# Patient Record
Sex: Female | Born: 1945 | Race: White | Hispanic: No | Marital: Married | Smoking: Never smoker
Health system: Southern US, Community
[De-identification: ages and names within clinical notes are randomized; demographics above are authoritative.]

## PROBLEM LIST (undated history)

## (undated) DIAGNOSIS — C569 Malignant neoplasm of unspecified ovary: Secondary | ICD-10-CM

## (undated) DIAGNOSIS — E785 Hyperlipidemia, unspecified: Secondary | ICD-10-CM

## (undated) DIAGNOSIS — M1990 Unspecified osteoarthritis, unspecified site: Secondary | ICD-10-CM

## (undated) DIAGNOSIS — I10 Essential (primary) hypertension: Secondary | ICD-10-CM

## (undated) DIAGNOSIS — E119 Type 2 diabetes mellitus without complications: Secondary | ICD-10-CM

## (undated) HISTORY — PX: KNEE SURGERY: SHX244

## (undated) HISTORY — PX: FRACTURE SURGERY: SHX138

## (undated) HISTORY — PX: ABDOMINAL HYSTERECTOMY: SHX81

## (undated) HISTORY — PX: LAPAROSCOPIC OOPHERECTOMY: SHX6507

## (undated) HISTORY — PX: APPENDECTOMY: SHX54

---

## 2005-08-16 ENCOUNTER — Ambulatory Visit: Payer: Self-pay | Admitting: Family Medicine

## 2006-12-27 ENCOUNTER — Ambulatory Visit: Payer: Self-pay | Admitting: Family Medicine

## 2007-12-28 ENCOUNTER — Ambulatory Visit: Payer: Self-pay | Admitting: Family Medicine

## 2009-04-17 ENCOUNTER — Ambulatory Visit: Payer: Self-pay | Admitting: Family Medicine

## 2009-06-03 ENCOUNTER — Ambulatory Visit: Payer: Self-pay | Admitting: Physician Assistant

## 2009-06-29 ENCOUNTER — Ambulatory Visit: Payer: Self-pay | Admitting: Physician Assistant

## 2010-03-25 ENCOUNTER — Ambulatory Visit: Payer: Self-pay | Admitting: Ophthalmology

## 2010-04-29 ENCOUNTER — Ambulatory Visit: Payer: Self-pay | Admitting: Ophthalmology

## 2015-03-03 DIAGNOSIS — M530 Cervicocranial syndrome: Secondary | ICD-10-CM | POA: Diagnosis not present

## 2015-03-03 DIAGNOSIS — M545 Low back pain: Secondary | ICD-10-CM | POA: Diagnosis not present

## 2015-03-03 DIAGNOSIS — M9903 Segmental and somatic dysfunction of lumbar region: Secondary | ICD-10-CM | POA: Diagnosis not present

## 2015-03-03 DIAGNOSIS — M9901 Segmental and somatic dysfunction of cervical region: Secondary | ICD-10-CM | POA: Diagnosis not present

## 2015-03-04 DIAGNOSIS — I10 Essential (primary) hypertension: Secondary | ICD-10-CM | POA: Diagnosis not present

## 2015-03-04 DIAGNOSIS — R52 Pain, unspecified: Secondary | ICD-10-CM | POA: Diagnosis not present

## 2015-03-04 DIAGNOSIS — F329 Major depressive disorder, single episode, unspecified: Secondary | ICD-10-CM | POA: Diagnosis not present

## 2015-03-04 DIAGNOSIS — H9190 Unspecified hearing loss, unspecified ear: Secondary | ICD-10-CM | POA: Diagnosis not present

## 2015-03-04 DIAGNOSIS — R81 Glycosuria: Secondary | ICD-10-CM | POA: Diagnosis not present

## 2015-03-04 DIAGNOSIS — M1990 Unspecified osteoarthritis, unspecified site: Secondary | ICD-10-CM | POA: Diagnosis not present

## 2015-03-04 DIAGNOSIS — E119 Type 2 diabetes mellitus without complications: Secondary | ICD-10-CM | POA: Diagnosis not present

## 2015-03-14 DIAGNOSIS — M545 Low back pain: Secondary | ICD-10-CM | POA: Diagnosis not present

## 2015-03-14 DIAGNOSIS — M9903 Segmental and somatic dysfunction of lumbar region: Secondary | ICD-10-CM | POA: Diagnosis not present

## 2015-03-14 DIAGNOSIS — M9901 Segmental and somatic dysfunction of cervical region: Secondary | ICD-10-CM | POA: Diagnosis not present

## 2015-03-14 DIAGNOSIS — M531 Cervicobrachial syndrome: Secondary | ICD-10-CM | POA: Diagnosis not present

## 2015-03-17 DIAGNOSIS — M531 Cervicobrachial syndrome: Secondary | ICD-10-CM | POA: Diagnosis not present

## 2015-03-17 DIAGNOSIS — M9903 Segmental and somatic dysfunction of lumbar region: Secondary | ICD-10-CM | POA: Diagnosis not present

## 2015-03-17 DIAGNOSIS — M545 Low back pain: Secondary | ICD-10-CM | POA: Diagnosis not present

## 2015-03-17 DIAGNOSIS — M9901 Segmental and somatic dysfunction of cervical region: Secondary | ICD-10-CM | POA: Diagnosis not present

## 2015-03-18 DIAGNOSIS — R81 Glycosuria: Secondary | ICD-10-CM | POA: Diagnosis not present

## 2015-03-18 DIAGNOSIS — M1990 Unspecified osteoarthritis, unspecified site: Secondary | ICD-10-CM | POA: Diagnosis not present

## 2015-03-18 DIAGNOSIS — E119 Type 2 diabetes mellitus without complications: Secondary | ICD-10-CM | POA: Diagnosis not present

## 2015-03-18 DIAGNOSIS — I10 Essential (primary) hypertension: Secondary | ICD-10-CM | POA: Diagnosis not present

## 2015-03-18 DIAGNOSIS — H9190 Unspecified hearing loss, unspecified ear: Secondary | ICD-10-CM | POA: Diagnosis not present

## 2015-03-20 DIAGNOSIS — M25561 Pain in right knee: Secondary | ICD-10-CM | POA: Diagnosis not present

## 2015-03-20 DIAGNOSIS — G8929 Other chronic pain: Secondary | ICD-10-CM | POA: Diagnosis not present

## 2015-03-20 DIAGNOSIS — M170 Bilateral primary osteoarthritis of knee: Secondary | ICD-10-CM | POA: Diagnosis not present

## 2015-03-20 DIAGNOSIS — M1611 Unilateral primary osteoarthritis, right hip: Secondary | ICD-10-CM | POA: Diagnosis not present

## 2015-03-20 DIAGNOSIS — M1612 Unilateral primary osteoarthritis, left hip: Secondary | ICD-10-CM | POA: Diagnosis not present

## 2015-03-20 DIAGNOSIS — M25551 Pain in right hip: Secondary | ICD-10-CM | POA: Diagnosis not present

## 2015-03-20 DIAGNOSIS — M75102 Unspecified rotator cuff tear or rupture of left shoulder, not specified as traumatic: Secondary | ICD-10-CM | POA: Diagnosis not present

## 2015-04-01 DIAGNOSIS — M545 Low back pain: Secondary | ICD-10-CM | POA: Diagnosis not present

## 2015-04-01 DIAGNOSIS — M9903 Segmental and somatic dysfunction of lumbar region: Secondary | ICD-10-CM | POA: Diagnosis not present

## 2015-04-01 DIAGNOSIS — M9901 Segmental and somatic dysfunction of cervical region: Secondary | ICD-10-CM | POA: Diagnosis not present

## 2015-04-01 DIAGNOSIS — M531 Cervicobrachial syndrome: Secondary | ICD-10-CM | POA: Diagnosis not present

## 2015-04-07 DIAGNOSIS — F419 Anxiety disorder, unspecified: Secondary | ICD-10-CM | POA: Diagnosis not present

## 2015-04-07 DIAGNOSIS — F329 Major depressive disorder, single episode, unspecified: Secondary | ICD-10-CM | POA: Diagnosis not present

## 2015-04-07 DIAGNOSIS — Z9114 Patient's other noncompliance with medication regimen: Secondary | ICD-10-CM | POA: Diagnosis not present

## 2015-04-07 DIAGNOSIS — M1990 Unspecified osteoarthritis, unspecified site: Secondary | ICD-10-CM | POA: Diagnosis not present

## 2015-04-07 DIAGNOSIS — E119 Type 2 diabetes mellitus without complications: Secondary | ICD-10-CM | POA: Diagnosis not present

## 2015-04-07 DIAGNOSIS — R454 Irritability and anger: Secondary | ICD-10-CM | POA: Diagnosis not present

## 2015-04-07 DIAGNOSIS — I10 Essential (primary) hypertension: Secondary | ICD-10-CM | POA: Diagnosis not present

## 2015-04-07 DIAGNOSIS — B379 Candidiasis, unspecified: Secondary | ICD-10-CM | POA: Diagnosis not present

## 2015-06-02 DIAGNOSIS — M24252 Disorder of ligament, left hip: Secondary | ICD-10-CM | POA: Diagnosis not present

## 2015-06-02 DIAGNOSIS — M11252 Other chondrocalcinosis, left hip: Secondary | ICD-10-CM | POA: Diagnosis not present

## 2015-06-02 DIAGNOSIS — M19012 Primary osteoarthritis, left shoulder: Secondary | ICD-10-CM | POA: Diagnosis not present

## 2015-06-02 DIAGNOSIS — M479 Spondylosis, unspecified: Secondary | ICD-10-CM | POA: Diagnosis not present

## 2015-06-02 DIAGNOSIS — Z91041 Radiographic dye allergy status: Secondary | ICD-10-CM | POA: Diagnosis not present

## 2015-06-02 DIAGNOSIS — M2550 Pain in unspecified joint: Secondary | ICD-10-CM | POA: Diagnosis not present

## 2015-06-02 DIAGNOSIS — E669 Obesity, unspecified: Secondary | ICD-10-CM | POA: Diagnosis not present

## 2015-06-02 DIAGNOSIS — M11211 Other chondrocalcinosis, right shoulder: Secondary | ICD-10-CM | POA: Diagnosis not present

## 2015-06-02 DIAGNOSIS — M19011 Primary osteoarthritis, right shoulder: Secondary | ICD-10-CM | POA: Diagnosis not present

## 2015-06-02 DIAGNOSIS — M24251 Disorder of ligament, right hip: Secondary | ICD-10-CM | POA: Diagnosis not present

## 2015-06-02 DIAGNOSIS — E119 Type 2 diabetes mellitus without complications: Secondary | ICD-10-CM | POA: Diagnosis not present

## 2015-06-02 DIAGNOSIS — Z8261 Family history of arthritis: Secondary | ICD-10-CM | POA: Diagnosis not present

## 2015-06-02 DIAGNOSIS — M160 Bilateral primary osteoarthritis of hip: Secondary | ICD-10-CM | POA: Diagnosis not present

## 2015-06-02 DIAGNOSIS — M11251 Other chondrocalcinosis, right hip: Secondary | ICD-10-CM | POA: Diagnosis not present

## 2015-06-02 DIAGNOSIS — M11241 Other chondrocalcinosis, right hand: Secondary | ICD-10-CM | POA: Diagnosis not present

## 2015-06-02 DIAGNOSIS — Z791 Long term (current) use of non-steroidal anti-inflammatories (NSAID): Secondary | ICD-10-CM | POA: Diagnosis not present

## 2015-06-02 DIAGNOSIS — M11242 Other chondrocalcinosis, left hand: Secondary | ICD-10-CM | POA: Diagnosis not present

## 2015-06-02 DIAGNOSIS — M19041 Primary osteoarthritis, right hand: Secondary | ICD-10-CM | POA: Diagnosis not present

## 2015-06-02 DIAGNOSIS — Z79899 Other long term (current) drug therapy: Secondary | ICD-10-CM | POA: Diagnosis not present

## 2015-06-02 DIAGNOSIS — I10 Essential (primary) hypertension: Secondary | ICD-10-CM | POA: Diagnosis not present

## 2015-06-02 DIAGNOSIS — M7502 Adhesive capsulitis of left shoulder: Secondary | ICD-10-CM | POA: Diagnosis not present

## 2015-06-02 DIAGNOSIS — Z794 Long term (current) use of insulin: Secondary | ICD-10-CM | POA: Diagnosis not present

## 2015-06-02 DIAGNOSIS — M170 Bilateral primary osteoarthritis of knee: Secondary | ICD-10-CM | POA: Diagnosis not present

## 2015-06-02 DIAGNOSIS — M19042 Primary osteoarthritis, left hand: Secondary | ICD-10-CM | POA: Diagnosis not present

## 2015-06-02 DIAGNOSIS — M7989 Other specified soft tissue disorders: Secondary | ICD-10-CM | POA: Diagnosis not present

## 2015-06-02 DIAGNOSIS — M1990 Unspecified osteoarthritis, unspecified site: Secondary | ICD-10-CM | POA: Diagnosis not present

## 2015-06-02 DIAGNOSIS — M11212 Other chondrocalcinosis, left shoulder: Secondary | ICD-10-CM | POA: Diagnosis not present

## 2015-06-02 DIAGNOSIS — E785 Hyperlipidemia, unspecified: Secondary | ICD-10-CM | POA: Diagnosis not present

## 2015-09-24 DIAGNOSIS — E7800 Pure hypercholesterolemia, unspecified: Secondary | ICD-10-CM | POA: Diagnosis not present

## 2015-09-24 DIAGNOSIS — R109 Unspecified abdominal pain: Secondary | ICD-10-CM | POA: Diagnosis not present

## 2015-09-24 DIAGNOSIS — Z9114 Patient's other noncompliance with medication regimen: Secondary | ICD-10-CM | POA: Diagnosis not present

## 2015-09-24 DIAGNOSIS — B379 Candidiasis, unspecified: Secondary | ICD-10-CM | POA: Diagnosis not present

## 2015-09-24 DIAGNOSIS — E119 Type 2 diabetes mellitus without complications: Secondary | ICD-10-CM | POA: Diagnosis not present

## 2015-09-24 DIAGNOSIS — I10 Essential (primary) hypertension: Secondary | ICD-10-CM | POA: Diagnosis not present

## 2015-09-24 DIAGNOSIS — R454 Irritability and anger: Secondary | ICD-10-CM | POA: Diagnosis not present

## 2015-09-24 DIAGNOSIS — F419 Anxiety disorder, unspecified: Secondary | ICD-10-CM | POA: Diagnosis not present

## 2015-09-24 DIAGNOSIS — F329 Major depressive disorder, single episode, unspecified: Secondary | ICD-10-CM | POA: Diagnosis not present

## 2015-09-24 DIAGNOSIS — M1990 Unspecified osteoarthritis, unspecified site: Secondary | ICD-10-CM | POA: Diagnosis not present

## 2015-10-01 DIAGNOSIS — B354 Tinea corporis: Secondary | ICD-10-CM | POA: Diagnosis not present

## 2015-10-01 DIAGNOSIS — I10 Essential (primary) hypertension: Secondary | ICD-10-CM | POA: Diagnosis not present

## 2015-10-01 DIAGNOSIS — L03119 Cellulitis of unspecified part of limb: Secondary | ICD-10-CM | POA: Diagnosis not present

## 2015-10-01 DIAGNOSIS — E119 Type 2 diabetes mellitus without complications: Secondary | ICD-10-CM | POA: Diagnosis not present

## 2015-10-01 DIAGNOSIS — M1990 Unspecified osteoarthritis, unspecified site: Secondary | ICD-10-CM | POA: Diagnosis not present

## 2015-10-01 DIAGNOSIS — Z9114 Patient's other noncompliance with medication regimen: Secondary | ICD-10-CM | POA: Diagnosis not present

## 2015-10-01 DIAGNOSIS — B379 Candidiasis, unspecified: Secondary | ICD-10-CM | POA: Diagnosis not present

## 2015-10-09 DIAGNOSIS — H9190 Unspecified hearing loss, unspecified ear: Secondary | ICD-10-CM | POA: Diagnosis not present

## 2015-10-09 DIAGNOSIS — I10 Essential (primary) hypertension: Secondary | ICD-10-CM | POA: Diagnosis not present

## 2015-10-09 DIAGNOSIS — Z9114 Patient's other noncompliance with medication regimen: Secondary | ICD-10-CM | POA: Diagnosis not present

## 2015-10-16 DIAGNOSIS — R0981 Nasal congestion: Secondary | ICD-10-CM | POA: Diagnosis not present

## 2015-10-16 DIAGNOSIS — R05 Cough: Secondary | ICD-10-CM | POA: Diagnosis not present

## 2015-10-16 DIAGNOSIS — J302 Other seasonal allergic rhinitis: Secondary | ICD-10-CM | POA: Diagnosis not present

## 2015-10-28 ENCOUNTER — Ambulatory Visit: Payer: Self-pay | Admitting: *Deleted

## 2015-11-05 DIAGNOSIS — E119 Type 2 diabetes mellitus without complications: Secondary | ICD-10-CM | POA: Diagnosis not present

## 2015-11-14 DIAGNOSIS — Z9114 Patient's other noncompliance with medication regimen: Secondary | ICD-10-CM | POA: Diagnosis not present

## 2015-11-14 DIAGNOSIS — B379 Candidiasis, unspecified: Secondary | ICD-10-CM | POA: Diagnosis not present

## 2015-11-14 DIAGNOSIS — F329 Major depressive disorder, single episode, unspecified: Secondary | ICD-10-CM | POA: Diagnosis not present

## 2015-11-14 DIAGNOSIS — I10 Essential (primary) hypertension: Secondary | ICD-10-CM | POA: Diagnosis not present

## 2015-11-14 DIAGNOSIS — E119 Type 2 diabetes mellitus without complications: Secondary | ICD-10-CM | POA: Diagnosis not present

## 2015-11-28 DIAGNOSIS — M9903 Segmental and somatic dysfunction of lumbar region: Secondary | ICD-10-CM | POA: Diagnosis not present

## 2015-11-28 DIAGNOSIS — M531 Cervicobrachial syndrome: Secondary | ICD-10-CM | POA: Diagnosis not present

## 2015-11-28 DIAGNOSIS — M5386 Other specified dorsopathies, lumbar region: Secondary | ICD-10-CM | POA: Diagnosis not present

## 2015-11-28 DIAGNOSIS — M9901 Segmental and somatic dysfunction of cervical region: Secondary | ICD-10-CM | POA: Diagnosis not present

## 2015-12-10 DIAGNOSIS — M5441 Lumbago with sciatica, right side: Secondary | ICD-10-CM | POA: Diagnosis not present

## 2015-12-10 DIAGNOSIS — M9903 Segmental and somatic dysfunction of lumbar region: Secondary | ICD-10-CM | POA: Diagnosis not present

## 2015-12-10 DIAGNOSIS — M531 Cervicobrachial syndrome: Secondary | ICD-10-CM | POA: Diagnosis not present

## 2015-12-10 DIAGNOSIS — M9901 Segmental and somatic dysfunction of cervical region: Secondary | ICD-10-CM | POA: Diagnosis not present

## 2015-12-19 DIAGNOSIS — M9901 Segmental and somatic dysfunction of cervical region: Secondary | ICD-10-CM | POA: Diagnosis not present

## 2015-12-19 DIAGNOSIS — M531 Cervicobrachial syndrome: Secondary | ICD-10-CM | POA: Diagnosis not present

## 2016-01-06 DIAGNOSIS — F329 Major depressive disorder, single episode, unspecified: Secondary | ICD-10-CM | POA: Diagnosis not present

## 2016-01-06 DIAGNOSIS — B379 Candidiasis, unspecified: Secondary | ICD-10-CM | POA: Diagnosis not present

## 2016-01-06 DIAGNOSIS — Z9119 Patient's noncompliance with other medical treatment and regimen: Secondary | ICD-10-CM | POA: Diagnosis not present

## 2016-01-06 DIAGNOSIS — I10 Essential (primary) hypertension: Secondary | ICD-10-CM | POA: Diagnosis not present

## 2016-01-06 DIAGNOSIS — E119 Type 2 diabetes mellitus without complications: Secondary | ICD-10-CM | POA: Diagnosis not present

## 2016-01-27 DIAGNOSIS — M5137 Other intervertebral disc degeneration, lumbosacral region: Secondary | ICD-10-CM | POA: Diagnosis not present

## 2016-01-27 DIAGNOSIS — M9901 Segmental and somatic dysfunction of cervical region: Secondary | ICD-10-CM | POA: Diagnosis not present

## 2016-01-27 DIAGNOSIS — M9903 Segmental and somatic dysfunction of lumbar region: Secondary | ICD-10-CM | POA: Diagnosis not present

## 2016-01-27 DIAGNOSIS — J209 Acute bronchitis, unspecified: Secondary | ICD-10-CM | POA: Diagnosis not present

## 2016-01-27 DIAGNOSIS — M531 Cervicobrachial syndrome: Secondary | ICD-10-CM | POA: Diagnosis not present

## 2016-02-18 DIAGNOSIS — M5137 Other intervertebral disc degeneration, lumbosacral region: Secondary | ICD-10-CM | POA: Diagnosis not present

## 2016-02-18 DIAGNOSIS — M9901 Segmental and somatic dysfunction of cervical region: Secondary | ICD-10-CM | POA: Diagnosis not present

## 2016-02-18 DIAGNOSIS — M9903 Segmental and somatic dysfunction of lumbar region: Secondary | ICD-10-CM | POA: Diagnosis not present

## 2016-02-18 DIAGNOSIS — M531 Cervicobrachial syndrome: Secondary | ICD-10-CM | POA: Diagnosis not present

## 2016-02-26 DIAGNOSIS — B379 Candidiasis, unspecified: Secondary | ICD-10-CM | POA: Diagnosis not present

## 2016-02-26 DIAGNOSIS — M1991 Primary osteoarthritis, unspecified site: Secondary | ICD-10-CM | POA: Diagnosis not present

## 2016-02-26 DIAGNOSIS — E1310 Other specified diabetes mellitus with ketoacidosis without coma: Secondary | ICD-10-CM | POA: Diagnosis not present

## 2016-03-02 DIAGNOSIS — M531 Cervicobrachial syndrome: Secondary | ICD-10-CM | POA: Diagnosis not present

## 2016-03-02 DIAGNOSIS — M5137 Other intervertebral disc degeneration, lumbosacral region: Secondary | ICD-10-CM | POA: Diagnosis not present

## 2016-03-02 DIAGNOSIS — M9901 Segmental and somatic dysfunction of cervical region: Secondary | ICD-10-CM | POA: Diagnosis not present

## 2016-03-02 DIAGNOSIS — M9903 Segmental and somatic dysfunction of lumbar region: Secondary | ICD-10-CM | POA: Diagnosis not present

## 2016-03-03 DIAGNOSIS — I10 Essential (primary) hypertension: Secondary | ICD-10-CM | POA: Diagnosis not present

## 2016-03-03 DIAGNOSIS — E1165 Type 2 diabetes mellitus with hyperglycemia: Secondary | ICD-10-CM | POA: Diagnosis not present

## 2016-03-03 DIAGNOSIS — E119 Type 2 diabetes mellitus without complications: Secondary | ICD-10-CM | POA: Diagnosis not present

## 2016-03-12 DIAGNOSIS — M531 Cervicobrachial syndrome: Secondary | ICD-10-CM | POA: Diagnosis not present

## 2016-03-12 DIAGNOSIS — M9903 Segmental and somatic dysfunction of lumbar region: Secondary | ICD-10-CM | POA: Diagnosis not present

## 2016-03-12 DIAGNOSIS — M9901 Segmental and somatic dysfunction of cervical region: Secondary | ICD-10-CM | POA: Diagnosis not present

## 2016-03-12 DIAGNOSIS — M5137 Other intervertebral disc degeneration, lumbosacral region: Secondary | ICD-10-CM | POA: Diagnosis not present

## 2016-03-22 DIAGNOSIS — M9901 Segmental and somatic dysfunction of cervical region: Secondary | ICD-10-CM | POA: Diagnosis not present

## 2016-03-22 DIAGNOSIS — M5137 Other intervertebral disc degeneration, lumbosacral region: Secondary | ICD-10-CM | POA: Diagnosis not present

## 2016-03-22 DIAGNOSIS — M9903 Segmental and somatic dysfunction of lumbar region: Secondary | ICD-10-CM | POA: Diagnosis not present

## 2016-03-22 DIAGNOSIS — M531 Cervicobrachial syndrome: Secondary | ICD-10-CM | POA: Diagnosis not present

## 2016-03-30 DIAGNOSIS — E119 Type 2 diabetes mellitus without complications: Secondary | ICD-10-CM | POA: Diagnosis not present

## 2016-04-19 DIAGNOSIS — M531 Cervicobrachial syndrome: Secondary | ICD-10-CM | POA: Diagnosis not present

## 2016-04-19 DIAGNOSIS — M5137 Other intervertebral disc degeneration, lumbosacral region: Secondary | ICD-10-CM | POA: Diagnosis not present

## 2016-04-19 DIAGNOSIS — M9903 Segmental and somatic dysfunction of lumbar region: Secondary | ICD-10-CM | POA: Diagnosis not present

## 2016-04-19 DIAGNOSIS — M9901 Segmental and somatic dysfunction of cervical region: Secondary | ICD-10-CM | POA: Diagnosis not present

## 2016-05-03 DIAGNOSIS — M9903 Segmental and somatic dysfunction of lumbar region: Secondary | ICD-10-CM | POA: Diagnosis not present

## 2016-05-03 DIAGNOSIS — M5137 Other intervertebral disc degeneration, lumbosacral region: Secondary | ICD-10-CM | POA: Diagnosis not present

## 2016-05-03 DIAGNOSIS — M531 Cervicobrachial syndrome: Secondary | ICD-10-CM | POA: Diagnosis not present

## 2016-05-03 DIAGNOSIS — M9901 Segmental and somatic dysfunction of cervical region: Secondary | ICD-10-CM | POA: Diagnosis not present

## 2016-05-24 DIAGNOSIS — M531 Cervicobrachial syndrome: Secondary | ICD-10-CM | POA: Diagnosis not present

## 2016-05-24 DIAGNOSIS — M5137 Other intervertebral disc degeneration, lumbosacral region: Secondary | ICD-10-CM | POA: Diagnosis not present

## 2016-05-24 DIAGNOSIS — M9903 Segmental and somatic dysfunction of lumbar region: Secondary | ICD-10-CM | POA: Diagnosis not present

## 2016-05-24 DIAGNOSIS — M9901 Segmental and somatic dysfunction of cervical region: Secondary | ICD-10-CM | POA: Diagnosis not present

## 2016-05-31 DIAGNOSIS — M5137 Other intervertebral disc degeneration, lumbosacral region: Secondary | ICD-10-CM | POA: Diagnosis not present

## 2016-05-31 DIAGNOSIS — M9901 Segmental and somatic dysfunction of cervical region: Secondary | ICD-10-CM | POA: Diagnosis not present

## 2016-05-31 DIAGNOSIS — G44219 Episodic tension-type headache, not intractable: Secondary | ICD-10-CM | POA: Diagnosis not present

## 2016-05-31 DIAGNOSIS — M9904 Segmental and somatic dysfunction of sacral region: Secondary | ICD-10-CM | POA: Diagnosis not present

## 2016-06-07 DIAGNOSIS — M5137 Other intervertebral disc degeneration, lumbosacral region: Secondary | ICD-10-CM | POA: Diagnosis not present

## 2016-06-07 DIAGNOSIS — M9901 Segmental and somatic dysfunction of cervical region: Secondary | ICD-10-CM | POA: Diagnosis not present

## 2016-06-07 DIAGNOSIS — G44219 Episodic tension-type headache, not intractable: Secondary | ICD-10-CM | POA: Diagnosis not present

## 2016-06-07 DIAGNOSIS — M9904 Segmental and somatic dysfunction of sacral region: Secondary | ICD-10-CM | POA: Diagnosis not present

## 2016-06-08 DIAGNOSIS — I10 Essential (primary) hypertension: Secondary | ICD-10-CM | POA: Diagnosis not present

## 2016-06-08 DIAGNOSIS — E119 Type 2 diabetes mellitus without complications: Secondary | ICD-10-CM | POA: Diagnosis not present

## 2016-06-08 DIAGNOSIS — E7800 Pure hypercholesterolemia, unspecified: Secondary | ICD-10-CM | POA: Diagnosis not present

## 2016-06-08 DIAGNOSIS — E1165 Type 2 diabetes mellitus with hyperglycemia: Secondary | ICD-10-CM | POA: Diagnosis not present

## 2016-06-08 DIAGNOSIS — E785 Hyperlipidemia, unspecified: Secondary | ICD-10-CM | POA: Diagnosis not present

## 2016-06-08 DIAGNOSIS — R065 Mouth breathing: Secondary | ICD-10-CM | POA: Diagnosis not present

## 2016-06-18 DIAGNOSIS — M79641 Pain in right hand: Secondary | ICD-10-CM | POA: Diagnosis not present

## 2016-06-18 DIAGNOSIS — M79642 Pain in left hand: Secondary | ICD-10-CM | POA: Diagnosis not present

## 2016-06-18 DIAGNOSIS — M9901 Segmental and somatic dysfunction of cervical region: Secondary | ICD-10-CM | POA: Diagnosis not present

## 2016-06-18 DIAGNOSIS — M25511 Pain in right shoulder: Secondary | ICD-10-CM | POA: Diagnosis not present

## 2016-06-18 DIAGNOSIS — M531 Cervicobrachial syndrome: Secondary | ICD-10-CM | POA: Diagnosis not present

## 2016-06-21 DIAGNOSIS — F329 Major depressive disorder, single episode, unspecified: Secondary | ICD-10-CM | POA: Diagnosis not present

## 2016-06-21 DIAGNOSIS — E119 Type 2 diabetes mellitus without complications: Secondary | ICD-10-CM | POA: Diagnosis not present

## 2016-06-21 DIAGNOSIS — B379 Candidiasis, unspecified: Secondary | ICD-10-CM | POA: Diagnosis not present

## 2016-06-21 DIAGNOSIS — E785 Hyperlipidemia, unspecified: Secondary | ICD-10-CM | POA: Diagnosis not present

## 2016-06-21 DIAGNOSIS — I10 Essential (primary) hypertension: Secondary | ICD-10-CM | POA: Diagnosis not present

## 2016-06-23 DIAGNOSIS — M79642 Pain in left hand: Secondary | ICD-10-CM | POA: Diagnosis not present

## 2016-06-23 DIAGNOSIS — M9901 Segmental and somatic dysfunction of cervical region: Secondary | ICD-10-CM | POA: Diagnosis not present

## 2016-06-23 DIAGNOSIS — M531 Cervicobrachial syndrome: Secondary | ICD-10-CM | POA: Diagnosis not present

## 2016-06-23 DIAGNOSIS — M25511 Pain in right shoulder: Secondary | ICD-10-CM | POA: Diagnosis not present

## 2016-06-23 DIAGNOSIS — M79641 Pain in right hand: Secondary | ICD-10-CM | POA: Diagnosis not present

## 2016-06-30 DIAGNOSIS — M9903 Segmental and somatic dysfunction of lumbar region: Secondary | ICD-10-CM | POA: Diagnosis not present

## 2016-06-30 DIAGNOSIS — M5441 Lumbago with sciatica, right side: Secondary | ICD-10-CM | POA: Diagnosis not present

## 2016-06-30 DIAGNOSIS — M5033 Other cervical disc degeneration, cervicothoracic region: Secondary | ICD-10-CM | POA: Diagnosis not present

## 2016-06-30 DIAGNOSIS — M9901 Segmental and somatic dysfunction of cervical region: Secondary | ICD-10-CM | POA: Diagnosis not present

## 2016-07-05 DIAGNOSIS — R0600 Dyspnea, unspecified: Secondary | ICD-10-CM | POA: Diagnosis not present

## 2016-07-05 DIAGNOSIS — I10 Essential (primary) hypertension: Secondary | ICD-10-CM | POA: Diagnosis not present

## 2016-07-05 DIAGNOSIS — B379 Candidiasis, unspecified: Secondary | ICD-10-CM | POA: Diagnosis not present

## 2016-07-05 DIAGNOSIS — F329 Major depressive disorder, single episode, unspecified: Secondary | ICD-10-CM | POA: Diagnosis not present

## 2016-07-05 DIAGNOSIS — L02231 Carbuncle of abdominal wall: Secondary | ICD-10-CM | POA: Diagnosis not present

## 2016-07-05 DIAGNOSIS — E119 Type 2 diabetes mellitus without complications: Secondary | ICD-10-CM | POA: Diagnosis not present

## 2016-07-27 DIAGNOSIS — M9903 Segmental and somatic dysfunction of lumbar region: Secondary | ICD-10-CM | POA: Diagnosis not present

## 2016-07-27 DIAGNOSIS — M5441 Lumbago with sciatica, right side: Secondary | ICD-10-CM | POA: Diagnosis not present

## 2016-07-27 DIAGNOSIS — M9901 Segmental and somatic dysfunction of cervical region: Secondary | ICD-10-CM | POA: Diagnosis not present

## 2016-07-27 DIAGNOSIS — M5033 Other cervical disc degeneration, cervicothoracic region: Secondary | ICD-10-CM | POA: Diagnosis not present

## 2016-08-25 DIAGNOSIS — M5136 Other intervertebral disc degeneration, lumbar region: Secondary | ICD-10-CM | POA: Diagnosis not present

## 2016-08-25 DIAGNOSIS — M9901 Segmental and somatic dysfunction of cervical region: Secondary | ICD-10-CM | POA: Diagnosis not present

## 2016-08-25 DIAGNOSIS — M5033 Other cervical disc degeneration, cervicothoracic region: Secondary | ICD-10-CM | POA: Diagnosis not present

## 2016-08-25 DIAGNOSIS — M9903 Segmental and somatic dysfunction of lumbar region: Secondary | ICD-10-CM | POA: Diagnosis not present

## 2016-09-06 DIAGNOSIS — F329 Major depressive disorder, single episode, unspecified: Secondary | ICD-10-CM | POA: Diagnosis not present

## 2016-09-06 DIAGNOSIS — E119 Type 2 diabetes mellitus without complications: Secondary | ICD-10-CM | POA: Diagnosis not present

## 2016-09-06 DIAGNOSIS — R319 Hematuria, unspecified: Secondary | ICD-10-CM | POA: Diagnosis not present

## 2016-09-06 DIAGNOSIS — R809 Proteinuria, unspecified: Secondary | ICD-10-CM | POA: Diagnosis not present

## 2016-09-06 DIAGNOSIS — E860 Dehydration: Secondary | ICD-10-CM | POA: Diagnosis not present

## 2016-09-23 DIAGNOSIS — Z1231 Encounter for screening mammogram for malignant neoplasm of breast: Secondary | ICD-10-CM | POA: Diagnosis not present

## 2016-09-24 DIAGNOSIS — E860 Dehydration: Secondary | ICD-10-CM | POA: Diagnosis not present

## 2016-09-24 DIAGNOSIS — F329 Major depressive disorder, single episode, unspecified: Secondary | ICD-10-CM | POA: Diagnosis not present

## 2016-09-24 DIAGNOSIS — R319 Hematuria, unspecified: Secondary | ICD-10-CM | POA: Diagnosis not present

## 2016-09-24 DIAGNOSIS — E119 Type 2 diabetes mellitus without complications: Secondary | ICD-10-CM | POA: Diagnosis not present

## 2016-09-24 DIAGNOSIS — R809 Proteinuria, unspecified: Secondary | ICD-10-CM | POA: Diagnosis not present

## 2016-09-28 DIAGNOSIS — M531 Cervicobrachial syndrome: Secondary | ICD-10-CM | POA: Diagnosis not present

## 2016-09-28 DIAGNOSIS — M9903 Segmental and somatic dysfunction of lumbar region: Secondary | ICD-10-CM | POA: Diagnosis not present

## 2016-09-28 DIAGNOSIS — M5441 Lumbago with sciatica, right side: Secondary | ICD-10-CM | POA: Diagnosis not present

## 2016-09-28 DIAGNOSIS — M9901 Segmental and somatic dysfunction of cervical region: Secondary | ICD-10-CM | POA: Diagnosis not present

## 2016-10-13 DIAGNOSIS — M5441 Lumbago with sciatica, right side: Secondary | ICD-10-CM | POA: Diagnosis not present

## 2016-10-13 DIAGNOSIS — M531 Cervicobrachial syndrome: Secondary | ICD-10-CM | POA: Diagnosis not present

## 2016-10-13 DIAGNOSIS — M9901 Segmental and somatic dysfunction of cervical region: Secondary | ICD-10-CM | POA: Diagnosis not present

## 2016-10-13 DIAGNOSIS — M9903 Segmental and somatic dysfunction of lumbar region: Secondary | ICD-10-CM | POA: Diagnosis not present

## 2016-10-14 DIAGNOSIS — N6321 Unspecified lump in the left breast, upper outer quadrant: Secondary | ICD-10-CM | POA: Diagnosis not present

## 2016-10-19 DIAGNOSIS — M5442 Lumbago with sciatica, left side: Secondary | ICD-10-CM | POA: Diagnosis not present

## 2016-10-19 DIAGNOSIS — M531 Cervicobrachial syndrome: Secondary | ICD-10-CM | POA: Diagnosis not present

## 2016-10-19 DIAGNOSIS — M5441 Lumbago with sciatica, right side: Secondary | ICD-10-CM | POA: Diagnosis not present

## 2016-10-19 DIAGNOSIS — M9901 Segmental and somatic dysfunction of cervical region: Secondary | ICD-10-CM | POA: Diagnosis not present

## 2016-10-19 DIAGNOSIS — M9903 Segmental and somatic dysfunction of lumbar region: Secondary | ICD-10-CM | POA: Diagnosis not present

## 2016-10-27 DIAGNOSIS — M5441 Lumbago with sciatica, right side: Secondary | ICD-10-CM | POA: Diagnosis not present

## 2016-10-27 DIAGNOSIS — M9903 Segmental and somatic dysfunction of lumbar region: Secondary | ICD-10-CM | POA: Diagnosis not present

## 2016-10-27 DIAGNOSIS — M9901 Segmental and somatic dysfunction of cervical region: Secondary | ICD-10-CM | POA: Diagnosis not present

## 2016-10-27 DIAGNOSIS — M5442 Lumbago with sciatica, left side: Secondary | ICD-10-CM | POA: Diagnosis not present

## 2016-10-27 DIAGNOSIS — M531 Cervicobrachial syndrome: Secondary | ICD-10-CM | POA: Diagnosis not present

## 2016-11-03 DIAGNOSIS — N390 Urinary tract infection, site not specified: Secondary | ICD-10-CM | POA: Diagnosis not present

## 2016-11-03 DIAGNOSIS — B379 Candidiasis, unspecified: Secondary | ICD-10-CM | POA: Diagnosis not present

## 2016-11-03 DIAGNOSIS — F329 Major depressive disorder, single episode, unspecified: Secondary | ICD-10-CM | POA: Diagnosis not present

## 2016-11-03 DIAGNOSIS — E119 Type 2 diabetes mellitus without complications: Secondary | ICD-10-CM | POA: Diagnosis not present

## 2016-11-08 DIAGNOSIS — E119 Type 2 diabetes mellitus without complications: Secondary | ICD-10-CM | POA: Diagnosis not present

## 2016-11-09 DIAGNOSIS — M9901 Segmental and somatic dysfunction of cervical region: Secondary | ICD-10-CM | POA: Diagnosis not present

## 2016-11-09 DIAGNOSIS — M531 Cervicobrachial syndrome: Secondary | ICD-10-CM | POA: Diagnosis not present

## 2016-11-09 DIAGNOSIS — M5442 Lumbago with sciatica, left side: Secondary | ICD-10-CM | POA: Diagnosis not present

## 2016-11-09 DIAGNOSIS — M9903 Segmental and somatic dysfunction of lumbar region: Secondary | ICD-10-CM | POA: Diagnosis not present

## 2016-11-19 DIAGNOSIS — M9901 Segmental and somatic dysfunction of cervical region: Secondary | ICD-10-CM | POA: Diagnosis not present

## 2016-11-19 DIAGNOSIS — M5442 Lumbago with sciatica, left side: Secondary | ICD-10-CM | POA: Diagnosis not present

## 2016-11-19 DIAGNOSIS — M531 Cervicobrachial syndrome: Secondary | ICD-10-CM | POA: Diagnosis not present

## 2016-11-19 DIAGNOSIS — M9903 Segmental and somatic dysfunction of lumbar region: Secondary | ICD-10-CM | POA: Diagnosis not present

## 2016-11-24 DIAGNOSIS — M531 Cervicobrachial syndrome: Secondary | ICD-10-CM | POA: Diagnosis not present

## 2016-11-24 DIAGNOSIS — M5441 Lumbago with sciatica, right side: Secondary | ICD-10-CM | POA: Diagnosis not present

## 2016-11-24 DIAGNOSIS — M9903 Segmental and somatic dysfunction of lumbar region: Secondary | ICD-10-CM | POA: Diagnosis not present

## 2016-11-24 DIAGNOSIS — M9901 Segmental and somatic dysfunction of cervical region: Secondary | ICD-10-CM | POA: Diagnosis not present

## 2016-11-24 DIAGNOSIS — M5386 Other specified dorsopathies, lumbar region: Secondary | ICD-10-CM | POA: Diagnosis not present

## 2016-11-26 DIAGNOSIS — M531 Cervicobrachial syndrome: Secondary | ICD-10-CM | POA: Diagnosis not present

## 2016-11-26 DIAGNOSIS — M5386 Other specified dorsopathies, lumbar region: Secondary | ICD-10-CM | POA: Diagnosis not present

## 2016-11-26 DIAGNOSIS — M9903 Segmental and somatic dysfunction of lumbar region: Secondary | ICD-10-CM | POA: Diagnosis not present

## 2016-11-26 DIAGNOSIS — M9901 Segmental and somatic dysfunction of cervical region: Secondary | ICD-10-CM | POA: Diagnosis not present

## 2016-11-26 DIAGNOSIS — M5441 Lumbago with sciatica, right side: Secondary | ICD-10-CM | POA: Diagnosis not present

## 2016-11-30 DIAGNOSIS — B379 Candidiasis, unspecified: Secondary | ICD-10-CM | POA: Diagnosis not present

## 2016-11-30 DIAGNOSIS — Z794 Long term (current) use of insulin: Secondary | ICD-10-CM | POA: Diagnosis not present

## 2016-11-30 DIAGNOSIS — Z7689 Persons encountering health services in other specified circumstances: Secondary | ICD-10-CM | POA: Diagnosis not present

## 2016-11-30 DIAGNOSIS — R399 Unspecified symptoms and signs involving the genitourinary system: Secondary | ICD-10-CM | POA: Diagnosis not present

## 2016-11-30 DIAGNOSIS — M150 Primary generalized (osteo)arthritis: Secondary | ICD-10-CM | POA: Diagnosis not present

## 2016-11-30 DIAGNOSIS — Z23 Encounter for immunization: Secondary | ICD-10-CM | POA: Diagnosis not present

## 2016-11-30 DIAGNOSIS — E119 Type 2 diabetes mellitus without complications: Secondary | ICD-10-CM | POA: Diagnosis not present

## 2016-12-07 DIAGNOSIS — M5387 Other specified dorsopathies, lumbosacral region: Secondary | ICD-10-CM | POA: Diagnosis not present

## 2016-12-07 DIAGNOSIS — M9903 Segmental and somatic dysfunction of lumbar region: Secondary | ICD-10-CM | POA: Diagnosis not present

## 2016-12-07 DIAGNOSIS — M531 Cervicobrachial syndrome: Secondary | ICD-10-CM | POA: Diagnosis not present

## 2016-12-07 DIAGNOSIS — M530 Cervicocranial syndrome: Secondary | ICD-10-CM | POA: Diagnosis not present

## 2016-12-07 DIAGNOSIS — M9901 Segmental and somatic dysfunction of cervical region: Secondary | ICD-10-CM | POA: Diagnosis not present

## 2016-12-07 DIAGNOSIS — M5386 Other specified dorsopathies, lumbar region: Secondary | ICD-10-CM | POA: Diagnosis not present

## 2016-12-15 DIAGNOSIS — M530 Cervicocranial syndrome: Secondary | ICD-10-CM | POA: Diagnosis not present

## 2016-12-15 DIAGNOSIS — M9903 Segmental and somatic dysfunction of lumbar region: Secondary | ICD-10-CM | POA: Diagnosis not present

## 2016-12-15 DIAGNOSIS — M5387 Other specified dorsopathies, lumbosacral region: Secondary | ICD-10-CM | POA: Diagnosis not present

## 2016-12-15 DIAGNOSIS — M5386 Other specified dorsopathies, lumbar region: Secondary | ICD-10-CM | POA: Diagnosis not present

## 2016-12-15 DIAGNOSIS — M9901 Segmental and somatic dysfunction of cervical region: Secondary | ICD-10-CM | POA: Diagnosis not present

## 2016-12-15 DIAGNOSIS — M531 Cervicobrachial syndrome: Secondary | ICD-10-CM | POA: Diagnosis not present

## 2016-12-16 DIAGNOSIS — E119 Type 2 diabetes mellitus without complications: Secondary | ICD-10-CM | POA: Diagnosis not present

## 2016-12-16 DIAGNOSIS — I10 Essential (primary) hypertension: Secondary | ICD-10-CM | POA: Diagnosis not present

## 2016-12-16 DIAGNOSIS — Z794 Long term (current) use of insulin: Secondary | ICD-10-CM | POA: Diagnosis not present

## 2016-12-16 DIAGNOSIS — Z1159 Encounter for screening for other viral diseases: Secondary | ICD-10-CM | POA: Diagnosis not present

## 2016-12-16 DIAGNOSIS — E782 Mixed hyperlipidemia: Secondary | ICD-10-CM | POA: Diagnosis not present

## 2016-12-16 DIAGNOSIS — R011 Cardiac murmur, unspecified: Secondary | ICD-10-CM | POA: Diagnosis not present

## 2016-12-27 DIAGNOSIS — M531 Cervicobrachial syndrome: Secondary | ICD-10-CM | POA: Diagnosis not present

## 2016-12-27 DIAGNOSIS — M9901 Segmental and somatic dysfunction of cervical region: Secondary | ICD-10-CM | POA: Diagnosis not present

## 2016-12-27 DIAGNOSIS — M9903 Segmental and somatic dysfunction of lumbar region: Secondary | ICD-10-CM | POA: Diagnosis not present

## 2016-12-27 DIAGNOSIS — S39012A Strain of muscle, fascia and tendon of lower back, initial encounter: Secondary | ICD-10-CM | POA: Diagnosis not present

## 2017-01-04 DIAGNOSIS — S39012A Strain of muscle, fascia and tendon of lower back, initial encounter: Secondary | ICD-10-CM | POA: Diagnosis not present

## 2017-01-04 DIAGNOSIS — M9903 Segmental and somatic dysfunction of lumbar region: Secondary | ICD-10-CM | POA: Diagnosis not present

## 2017-01-04 DIAGNOSIS — M530 Cervicocranial syndrome: Secondary | ICD-10-CM | POA: Diagnosis not present

## 2017-01-04 DIAGNOSIS — M9901 Segmental and somatic dysfunction of cervical region: Secondary | ICD-10-CM | POA: Diagnosis not present

## 2017-01-04 DIAGNOSIS — M5386 Other specified dorsopathies, lumbar region: Secondary | ICD-10-CM | POA: Diagnosis not present

## 2017-01-04 DIAGNOSIS — M531 Cervicobrachial syndrome: Secondary | ICD-10-CM | POA: Diagnosis not present

## 2017-01-12 DIAGNOSIS — M530 Cervicocranial syndrome: Secondary | ICD-10-CM | POA: Diagnosis not present

## 2017-01-12 DIAGNOSIS — M531 Cervicobrachial syndrome: Secondary | ICD-10-CM | POA: Diagnosis not present

## 2017-01-12 DIAGNOSIS — M5386 Other specified dorsopathies, lumbar region: Secondary | ICD-10-CM | POA: Diagnosis not present

## 2017-01-12 DIAGNOSIS — M9901 Segmental and somatic dysfunction of cervical region: Secondary | ICD-10-CM | POA: Diagnosis not present

## 2017-01-12 DIAGNOSIS — M9903 Segmental and somatic dysfunction of lumbar region: Secondary | ICD-10-CM | POA: Diagnosis not present

## 2017-01-12 DIAGNOSIS — S39012A Strain of muscle, fascia and tendon of lower back, initial encounter: Secondary | ICD-10-CM | POA: Diagnosis not present

## 2017-01-18 DIAGNOSIS — M531 Cervicobrachial syndrome: Secondary | ICD-10-CM | POA: Diagnosis not present

## 2017-01-18 DIAGNOSIS — M5417 Radiculopathy, lumbosacral region: Secondary | ICD-10-CM | POA: Diagnosis not present

## 2017-01-18 DIAGNOSIS — M9903 Segmental and somatic dysfunction of lumbar region: Secondary | ICD-10-CM | POA: Diagnosis not present

## 2017-01-18 DIAGNOSIS — M9901 Segmental and somatic dysfunction of cervical region: Secondary | ICD-10-CM | POA: Diagnosis not present

## 2017-01-25 DIAGNOSIS — E119 Type 2 diabetes mellitus without complications: Secondary | ICD-10-CM | POA: Diagnosis not present

## 2017-01-26 DIAGNOSIS — F339 Major depressive disorder, recurrent, unspecified: Secondary | ICD-10-CM | POA: Diagnosis not present

## 2017-01-26 DIAGNOSIS — M5417 Radiculopathy, lumbosacral region: Secondary | ICD-10-CM | POA: Diagnosis not present

## 2017-01-26 DIAGNOSIS — M9903 Segmental and somatic dysfunction of lumbar region: Secondary | ICD-10-CM | POA: Diagnosis not present

## 2017-01-26 DIAGNOSIS — R319 Hematuria, unspecified: Secondary | ICD-10-CM | POA: Diagnosis not present

## 2017-01-27 DIAGNOSIS — E119 Type 2 diabetes mellitus without complications: Secondary | ICD-10-CM | POA: Diagnosis not present

## 2017-01-27 DIAGNOSIS — R319 Hematuria, unspecified: Secondary | ICD-10-CM | POA: Diagnosis not present

## 2017-01-27 DIAGNOSIS — Z794 Long term (current) use of insulin: Secondary | ICD-10-CM | POA: Diagnosis not present

## 2017-02-01 DIAGNOSIS — M4803 Spinal stenosis, cervicothoracic region: Secondary | ICD-10-CM | POA: Diagnosis not present

## 2017-02-01 DIAGNOSIS — M5386 Other specified dorsopathies, lumbar region: Secondary | ICD-10-CM | POA: Diagnosis not present

## 2017-02-01 DIAGNOSIS — M9901 Segmental and somatic dysfunction of cervical region: Secondary | ICD-10-CM | POA: Diagnosis not present

## 2017-02-01 DIAGNOSIS — M9903 Segmental and somatic dysfunction of lumbar region: Secondary | ICD-10-CM | POA: Diagnosis not present

## 2017-02-04 DIAGNOSIS — M9903 Segmental and somatic dysfunction of lumbar region: Secondary | ICD-10-CM | POA: Diagnosis not present

## 2017-02-04 DIAGNOSIS — M4803 Spinal stenosis, cervicothoracic region: Secondary | ICD-10-CM | POA: Diagnosis not present

## 2017-02-04 DIAGNOSIS — M5386 Other specified dorsopathies, lumbar region: Secondary | ICD-10-CM | POA: Diagnosis not present

## 2017-02-04 DIAGNOSIS — M9901 Segmental and somatic dysfunction of cervical region: Secondary | ICD-10-CM | POA: Diagnosis not present

## 2017-02-16 DIAGNOSIS — M9901 Segmental and somatic dysfunction of cervical region: Secondary | ICD-10-CM | POA: Diagnosis not present

## 2017-02-16 DIAGNOSIS — M9903 Segmental and somatic dysfunction of lumbar region: Secondary | ICD-10-CM | POA: Diagnosis not present

## 2017-02-16 DIAGNOSIS — M4803 Spinal stenosis, cervicothoracic region: Secondary | ICD-10-CM | POA: Diagnosis not present

## 2017-02-16 DIAGNOSIS — M5386 Other specified dorsopathies, lumbar region: Secondary | ICD-10-CM | POA: Diagnosis not present

## 2017-02-25 DIAGNOSIS — M5386 Other specified dorsopathies, lumbar region: Secondary | ICD-10-CM | POA: Diagnosis not present

## 2017-02-25 DIAGNOSIS — M9901 Segmental and somatic dysfunction of cervical region: Secondary | ICD-10-CM | POA: Diagnosis not present

## 2017-02-25 DIAGNOSIS — M4803 Spinal stenosis, cervicothoracic region: Secondary | ICD-10-CM | POA: Diagnosis not present

## 2017-02-25 DIAGNOSIS — M9903 Segmental and somatic dysfunction of lumbar region: Secondary | ICD-10-CM | POA: Diagnosis not present

## 2017-03-23 ENCOUNTER — Other Ambulatory Visit: Payer: Self-pay | Admitting: Family Medicine

## 2017-03-23 DIAGNOSIS — Z13820 Encounter for screening for osteoporosis: Secondary | ICD-10-CM

## 2017-04-09 ENCOUNTER — Emergency Department: Payer: Medicare HMO

## 2017-04-09 ENCOUNTER — Emergency Department
Admission: EM | Admit: 2017-04-09 | Discharge: 2017-04-09 | Disposition: A | Discharge status: Home / Self Care | Payer: Medicare HMO | Attending: Emergency Medicine | Admitting: Emergency Medicine

## 2017-04-09 ENCOUNTER — Other Ambulatory Visit: Payer: Self-pay

## 2017-04-09 ENCOUNTER — Encounter: Payer: Self-pay | Admitting: Emergency Medicine

## 2017-04-09 DIAGNOSIS — S7001XA Contusion of right hip, initial encounter: Secondary | ICD-10-CM

## 2017-04-09 DIAGNOSIS — Y939 Activity, unspecified: Secondary | ICD-10-CM | POA: Diagnosis not present

## 2017-04-09 DIAGNOSIS — W19XXXA Unspecified fall, initial encounter: Secondary | ICD-10-CM | POA: Insufficient documentation

## 2017-04-09 DIAGNOSIS — Y929 Unspecified place or not applicable: Secondary | ICD-10-CM | POA: Insufficient documentation

## 2017-04-09 DIAGNOSIS — Y999 Unspecified external cause status: Secondary | ICD-10-CM | POA: Diagnosis not present

## 2017-04-09 DIAGNOSIS — S79911A Unspecified injury of right hip, initial encounter: Secondary | ICD-10-CM | POA: Diagnosis present

## 2017-04-09 HISTORY — DX: Unspecified osteoarthritis, unspecified site: M19.90

## 2017-04-09 LAB — GLUCOSE, CAPILLARY: GLUCOSE-CAPILLARY: 204 mg/dL — AB (ref 65–99)

## 2017-04-09 MED ORDER — HYDROMORPHONE HCL 1 MG/ML IJ SOLN
0.5000 mg | Freq: Once | INTRAMUSCULAR | Status: AC
  Administered 2017-04-09: 0.5 mg via INTRAMUSCULAR
  Filled 2017-04-09: qty 1

## 2017-04-09 MED ORDER — TRAMADOL HCL 50 MG PO TABS: 50.0000 mg | ORAL_TABLET | Freq: Two times a day (BID) | ORAL | 0 refills | Status: DC | PRN

## 2017-04-09 NOTE — ED Triage Notes (Signed)
R foot and hip pain since fell one week ago.

## 2017-04-09 NOTE — ED Provider Notes (Signed)
[facility] Emergency Department Provider Note   ____________________________________________   First MD Initiated Contact with Patient 04/09/17 1456     (approximate)  I have reviewed the triage vital signs and the nursing notes.   HISTORY  Chief Complaint Foot Pain and Hip Pain    HPI Monica Mullins is a 72 y.o. female patient complain right foot and right hip pain status post fall 1 week ago.  Patient saw chiropractor 2 days after a fall and state manipulation gave her some mild relief.  Pain returned 2 days ago.  No pulses measured for pain.  Patient has a history of arthritis.  Past Medical History:  Diagnosis Date  . Arthritis     There are no active problems to display for this patient.   History reviewed. No pertinent surgical history.  Prior to Admission medications   Medication Sig Start Date End Date Taking? Authorizing Provider  traMADol (ULTRAM) 50 MG tablet Take 1 tablet (50 mg total) by mouth every 12 (twelve) hours as needed. 04/09/17   Sable Feil, PA-C    Allergies Patient has no known allergies.  No family history on file.  Social History Social History   Tobacco Use  . Smoking status: Not on file  Substance Use Topics  . Alcohol use: Not on file  . Drug use: Not on file    Review of Systems Constitutional: No fever/chills Eyes: No visual changes. ENT: No sore throat. Cardiovascular: Denies chest pain. Respiratory: Denies shortness of breath. Gastrointestinal: No abdominal pain.  No nausea, no vomiting.  No diarrhea.  No constipation. Genitourinary: Negative for dysuria. Musculoskeletal: Negative for back pain. Skin: Negative for rash. Neurological: Negative for headaches, focal weakness or numbness. Endocrine:Diabetes and hypertension Hematological/Lymphatic: Allergic/Immunilogical: **}  ____________________________________________   PHYSICAL EXAM:  VITAL SIGNS: ED Triage Vitals [04/09/17 1343]    Enc Vitals Group     BP (!) 172/74     Pulse Rate (!) 56     Resp 20     Temp 97.9 F (36.6 C)     Temp Source Oral     SpO2 97 %     Weight 245 lb (111.1 kg)     Height 5\' 7"  (1.702 m)     Head Circumference      Peak Flow      Pain Score 10     Pain Loc      Pain Edu?      Excl. in [place]?     Constitutional: Alert and oriented. Well appearing and in no acute distress. Cardiovascular: Normal rate, regular rhythm. Grossly normal heart sounds.  Good peripheral circulation.  Elevated blood pressure Respiratory: Normal respiratory effort.  No retractions. Lungs CTAB. Gastrointestinal: Soft and nontender. No distention. No abdominal bruits. No CVA tenderness. Musculoskeletal: No obvious hip deformity.  No ecchymosis or abrasion.  Moderate guarding palpation iliac crest.  No lower extremity tenderness.  Lateral ankle edema.  No joint effusions. Neurologic:  Normal speech and language. No gross focal neurologic deficits are appreciated. No gait instability. Skin:  Skin is warm, dry and intact. No rash noted. Psychiatric: Mood and affect are normal. Speech and behavior are normal.  ____________________________________________   LABS (all labs ordered are listed, but only abnormal results are displayed)  Labs Reviewed  GLUCOSE, CAPILLARY - Abnormal; Notable for the following components:      Result Value   Glucose-Capillary 204 (*)    All other components within normal limits  CBG  MONITORING, ED   ____________________________________________  EKG   ____________________________________________  RADIOLOGY  ED MD interpretation: No acute findings on x-ray of the right hip.  Patient has some mild arthritic changes. Official radiology report(s): Dg Hip Unilat W Or Wo Pelvis 2-3 Views Right  Result Date: 04/09/2017 CLINICAL DATA:  Acute right hip pain following fall 1 week ago. Initial encounter. EXAM: DG HIP (WITH OR WITHOUT PELVIS) 2-3V RIGHT COMPARISON:  None. FINDINGS: There  is no evidence of acute fracture, subluxation or dislocation. Mild degenerative changes in both hips are present. No suspicious bony lesions are identified. IMPRESSION: No acute abnormality. Electronically Signed   By: Margarette Canada M.D.   On: 04/09/2017 15:34    ____________________________________________   PROCEDURES  Procedure(s) performed: None  Procedures  Critical Care performed: No  ____________________________________________   INITIAL IMPRESSION / ASSESSMENT AND PLAN / ED COURSE  As part of my medical decision making, I reviewed the following data within the electronic MEDICAL RECORD NUMBER    Right hip pain secondary to contusion.  Discussed x-ray findings with patient.  Patient given discharge care instruction advised to take tramadol twice a day as needed for pain.  Patient advised to follow-up with [facility] if condition persists.      ____________________________________________   FINAL CLINICAL IMPRESSION(S) / ED DIAGNOSES  Final diagnoses:  Contusion of right hip, initial encounter     ED Discharge Orders        Ordered    traMADol (ULTRAM) 50 MG tablet  Every 12 hours PRN     04/09/17 1556       Note:  This document was prepared using Dragon voice recognition software and may include unintentional dictation errors.    Sable Feil, PA-C 04/09/17 1558    Lisa Roca, MD 04/12/17 1135

## 2017-04-09 NOTE — ED Notes (Signed)
Pt states she tripped and fell down some steps last week, had some back pain and went to the chiropractor which helped some but now continued pain in hip and both feet

## 2018-01-13 ENCOUNTER — Other Ambulatory Visit: Payer: Self-pay

## 2018-01-13 ENCOUNTER — Emergency Department: Payer: Medicare HMO

## 2018-01-13 ENCOUNTER — Emergency Department
Admission: EM | Admit: 2018-01-13 | Discharge: 2018-01-13 | Disposition: A | Discharge status: Home / Self Care | Payer: Medicare HMO | Attending: Emergency Medicine | Admitting: Emergency Medicine

## 2018-01-13 DIAGNOSIS — I4891 Unspecified atrial fibrillation: Secondary | ICD-10-CM | POA: Diagnosis present

## 2018-01-13 LAB — URINALYSIS, COMPLETE (UACMP) WITH MICROSCOPIC
Bilirubin Urine: NEGATIVE
Ketones, ur: NEGATIVE mg/dL
Leukocytes, UA: NEGATIVE
Nitrite: NEGATIVE
PROTEIN: 30 mg/dL — AB
Specific Gravity, Urine: 1.021 (ref 1.005–1.030)
pH: 5 (ref 5.0–8.0)

## 2018-01-13 LAB — COMPREHENSIVE METABOLIC PANEL
ALK PHOS: 107 U/L (ref 38–126)
ALT: 18 U/L (ref 0–44)
AST: 27 U/L (ref 15–41)
Albumin: 3.5 g/dL (ref 3.5–5.0)
Anion gap: 7 (ref 5–15)
BUN: 16 mg/dL (ref 8–23)
CO2: 24 mmol/L (ref 22–32)
CREATININE: 0.51 mg/dL (ref 0.44–1.00)
Calcium: 9 mg/dL (ref 8.9–10.3)
Chloride: 109 mmol/L (ref 98–111)
GFR calc non Af Amer: >60 mL/min (ref >60)
GLUCOSE: 216 mg/dL — AB (ref 70–99)
POTASSIUM: 4 mmol/L (ref 3.5–5.1)
SODIUM: 140 mmol/L (ref 135–145)
Total Bilirubin: 0.7 mg/dL (ref 0.3–1.2)
Total Protein: 6.6 g/dL (ref 6.5–8.1)

## 2018-01-13 LAB — CBC
HCT: 45.4 % (ref 36.0–46.0)
HEMOGLOBIN: 15.1 g/dL — AB (ref 12.0–15.0)
MCH: 32.7 pg (ref 26.0–34.0)
MCHC: 33.3 g/dL (ref 30.0–36.0)
MCV: 98.3 fL (ref 80.0–100.0)
Platelets: 154 10*3/uL (ref 150–400)
RBC: 4.62 MIL/uL (ref 3.87–5.11)
RDW: 12.4 % (ref 11.5–15.5)
WBC: 7.1 10*3/uL (ref 4.0–10.5)
nRBC: 0 % (ref 0.0–0.2)

## 2018-01-13 LAB — TROPONIN I: Troponin I: <0.03 ng/mL (ref <0.03)

## 2018-01-13 MED ORDER — METOPROLOL TARTRATE 25 MG PO TABS: 25.0000 mg | ORAL_TABLET | Freq: Two times a day (BID) | ORAL | 0 refills | Status: AC

## 2018-01-13 MED ORDER — APIXABAN 5 MG PO TABS
5.0000 mg | ORAL_TABLET | Freq: Once | ORAL | Status: AC
  Administered 2018-01-13: 5 mg via ORAL
  Filled 2018-01-13: qty 1

## 2018-01-13 MED ORDER — APIXABAN 5 MG PO TABS: 5.0000 mg | ORAL_TABLET | Freq: Two times a day (BID) | ORAL | 0 refills | Status: AC

## 2018-01-13 MED ORDER — METOPROLOL TARTRATE 25 MG PO TABS
25.0000 mg | ORAL_TABLET | Freq: Once | ORAL | Status: AC
  Administered 2018-01-13: 25 mg via ORAL
  Filled 2018-01-13: qty 1

## 2018-01-13 NOTE — ED Provider Notes (Signed)
[facility] Emergency Department Provider Note   ____________________________________________    I have reviewed the triage vital signs and the nursing notes.   HISTORY  Chief Complaint Atrial Fibrillation     HPI Monica Mullins is a 72 y.o. female who was sent from her primary care office because of the discovery of atrial fibrillation.  Patient reports she was at her primary care office being treated for a yeast infection underneath her breasts.  She has no chest pain or shortness of breath.  Overall she feels well.  No nausea vomiting or diaphoresis.  No history of arrhythmia.  No new medications.  Unclear when arrhythmia started.  She has no palpitations  Past Medical History:  Diagnosis Date  . Arthritis     There are no active problems to display for this patient.   No past surgical history on file.  Prior to Admission medications   Medication Sig Start Date End Date Taking? Authorizing Provider  traMADol (ULTRAM) 50 MG tablet Take 1 tablet (50 mg total) by mouth every 12 (twelve) hours as needed. 04/09/17   Sable Feil, PA-C     Allergies Contrast media [iodinated diagnostic agents]  No family history on file.  Social History Social History   Tobacco Use  . Smoking status: Not on file  Substance Use Topics  . Alcohol use: Not on file  . Drug use: Not on file    Review of Systems  Constitutional: No fever/chills Eyes: No visual changes.  ENT: No sore throat. Cardiovascular: Denies chest pain. Respiratory: Denies shortness of breath. Gastrointestinal: No abdominal pain.  Genitourinary: Negative for dysuria. Musculoskeletal: Chronic arthritic pain Skin: As above Neurological: Negative for headaches   ____________________________________________   PHYSICAL EXAM:  VITAL SIGNS: ED Triage Vitals  Enc Vitals Group     BP 01/13/18 1743 (!) 175/98     Pulse Rate 01/13/18 1743 (!) 107     Resp 01/13/18 1743 17   Temp 01/13/18 1743 98 F (36.7 C)     Temp Source 01/13/18 1743 Oral     SpO2 01/13/18 1743 99 %     Weight 01/13/18 1806 111.1 kg (245 lb)     Height 01/13/18 1806 1.702 m (5\' 7" )     Head Circumference --      Peak Flow --      Pain Score 01/13/18 1805 10     Pain Loc --      Pain Edu? --      Excl. in [place]? --     Constitutional: Alert and oriented. No acute distress. Pleasant and interactive Eyes: Conjunctivae are normal.   Nose: No congestion/rhinnorhea. Mouth/Throat: Mucous membranes are moist.    Cardiovascular: Mild tachycardia, irregularly irregular rhythm. Kermit Balo peripheral circulation. Respiratory: Normal respiratory effort.  No retractions.  Gastrointestinal: Soft and nontender. No distention.   Genitourinary: deferred Musculoskeletal: No lower extremity tenderness nor edema.   Neurologic:  Normal speech and language. No gross focal neurologic deficits are appreciated.  Skin:  Skin is warm, dry and intact.  Rash beneath the breast consistent with fungal rash Psychiatric: Mood and affect are normal. Speech and behavior are normal.  ____________________________________________   LABS (all labs ordered are listed, but only abnormal results are displayed)  Labs Reviewed  CBC - Abnormal; Notable for the following components:      Result Value   Hemoglobin 15.1 (*)    All other components within normal limits  COMPREHENSIVE METABOLIC  PANEL - Abnormal; Notable for the following components:   Glucose, Bld 216 (*)    All other components within normal limits  URINALYSIS, COMPLETE (UACMP) WITH MICROSCOPIC - Abnormal; Notable for the following components:   Color, Urine AMBER (*)    APPearance HAZY (*)    Glucose, UA >=500 (*)    Hgb urine dipstick SMALL (*)    Protein, ur 30 (*)    Bacteria, UA MANY (*)    All other components within normal limits  TROPONIN I   ____________________________________________  EKG  ED ECG REPORT I, Lavonia Drafts, the attending  physician, personally viewed and interpreted this ECG.  Date: 01/13/2018  Rate: 110 Rhythm: Atrial fibrillation QRS Axis: normal Intervals: normal ST/T Wave abnormalities: normal Narrative Interpretation: no evidence of acute ischemia  ____________________________________________  RADIOLOGY  Chest x-ray unremarkable ____________________________________________   PROCEDURES  Procedure(s) performed: No  Procedures   Critical Care performed: No ____________________________________________   INITIAL IMPRESSION / ASSESSMENT AND PLAN / ED COURSE  Pertinent labs & imaging results that were available during my care of the patient were reviewed by me and considered in my medical decision making (see chart for details).  Patient presents with new discovery of atrial fibrillation, unclear when this started.  She is asymptomatic however.  Chads 2 score is 2.  Will discuss with cardiology, anticipate discharge  Discussed with Dr. Yancey Flemings of cardiology, he recommends Eliquis 3 times daily 5 mg and metoprolol 25 mg twice daily      ____________________________________________   FINAL CLINICAL IMPRESSION(S) / ED DIAGNOSES  Final diagnoses:  Atrial fibrillation, unspecified type [facility])        Note:  This document was prepared using Dragon voice recognition software and may include unintentional dictation errors.    Lavonia Drafts, MD 01/13/18 [phone]

## 2018-01-13 NOTE — ED Triage Notes (Signed)
Pt brought in by [facility] coming from [facility] for new onset A-fib. She went into office for yeast infection on her body and she was noted to have irregular heart rhythm. EKG was done in the office and showed A-fib with RVR. Pt denies chest pain. States she is "hurting all over" due to her arthritis. MD at bedside when pt arrived.

## 2018-04-25 ENCOUNTER — Other Ambulatory Visit: Payer: Self-pay

## 2018-04-25 ENCOUNTER — Ambulatory Visit
Admission: EM | Admit: 2018-04-25 | Discharge: 2018-04-25 | Disposition: A | Discharge status: Home / Self Care | Payer: Medicare HMO | Attending: Family Medicine | Admitting: Family Medicine

## 2018-04-25 ENCOUNTER — Ambulatory Visit (INDEPENDENT_AMBULATORY_CARE_PROVIDER_SITE_OTHER): Payer: Medicare HMO

## 2018-04-25 ENCOUNTER — Encounter: Payer: Self-pay | Admitting: Emergency Medicine

## 2018-04-25 DIAGNOSIS — I10 Essential (primary) hypertension: Secondary | ICD-10-CM

## 2018-04-25 DIAGNOSIS — M1712 Unilateral primary osteoarthritis, left knee: Secondary | ICD-10-CM

## 2018-04-25 DIAGNOSIS — L304 Erythema intertrigo: Secondary | ICD-10-CM | POA: Diagnosis not present

## 2018-04-25 HISTORY — DX: Hyperlipidemia, unspecified: E78.5

## 2018-04-25 HISTORY — DX: Malignant neoplasm of unspecified ovary: C56.9

## 2018-04-25 HISTORY — DX: Type 2 diabetes mellitus without complications: E11.9

## 2018-04-25 HISTORY — DX: Essential (primary) hypertension: I10

## 2018-04-25 MED ORDER — FLUCONAZOLE 150 MG PO TABS: 150.0000 mg | ORAL_TABLET | ORAL | 0 refills | Status: AC

## 2018-04-25 NOTE — ED Triage Notes (Signed)
Patient also c/o yeast infection under abdomen and breasts. Patient has been using Nystatin.

## 2018-04-25 NOTE — ED Triage Notes (Signed)
Patient in today c/o left knee pain x 2 days. Patient states she can't put weight on her knee. Patient states no injury noted.

## 2018-04-25 NOTE — ED Provider Notes (Signed)
[facility]    CSN: [id number] Arrival date & time: 04/25/18  1429  History   Chief Complaint Chief Complaint  Patient presents with  . Knee Pain    left   HPI  73 year old female presents with left knee pain.  Patient also reports yeast infection of the abdomen and breast.  Patient reports that she has had left knee pain since Christmas.  She states that it feels like it is "going to pop out".  She reports instability.  Has been worse over the past 2 weeks per her report.  No recent fall, trauma, injury.  Patient also reports that she has had ongoing redness/ irritation underneath the breast and the abdominal pannus.  Also affects the perineal region.  She has been applying nystatin with improvement.  No other associated symptoms.  No other complaints.  PMH, Surgical Hx, Family Hx, Social History reviewed and updated as below.  Past Medical History:  Diagnosis Date  . Arthritis   . Diabetes mellitus without complication ([place])   . Hyperlipidemia   . Hypertension   . Ovarian cancer [facility])    Past Surgical History:  Procedure Laterality Date  . ABDOMINAL HYSTERECTOMY    . APPENDECTOMY    . FRACTURE SURGERY Left    arm  . KNEE SURGERY Left   . LAPAROSCOPIC OOPHERECTOMY     OB History   No obstetric history on file.    Home Medications    Prior to Admission medications   Medication Sig Start Date End Date Taking? Authorizing Provider  apixaban (ELIQUIS) 5 MG TABS tablet Take 1 tablet (5 mg total) by mouth 2 (two) times daily. 01/13/18  Yes Lavonia Drafts, MD  celecoxib (CELEBREX) 100 MG capsule Take 1 capsule by mouth 2 (two) times daily as needed.   Yes [provider]  fexofenadine-pseudoephedrine (ALLEGRA-D) 60-120 MG 12 hr tablet Take by mouth.   Yes [provider]  lisinopril (PRINIVIL,ZESTRIL) 10 MG tablet Take by mouth. 09/29/17  Yes [provider]  metoprolol tartrate (LOPRESSOR) 25 MG tablet Take 1 tablet (25 mg total) by  mouth 2 (two) times daily. 01/13/18 01/13/19 Yes Lavonia Drafts, MD  nystatin (MYCOSTATIN/NYSTOP) powder Apply topically. 11/01/17 11/01/18 Yes [provider]  nystatin cream (MYCOSTATIN) Apply topically.   Yes [provider]  venlafaxine XR (EFFEXOR-XR) 75 MG 24 hr capsule Take by mouth. 06/29/17  Yes [provider]  fluconazole (DIFLUCAN) 150 MG tablet Take 1 tablet (150 mg total) by mouth once a week. For 4 weeks 04/25/18   Coral Spikes, DO  furosemide (LASIX) 20 MG tablet Take 20 mg by mouth daily. 02/25/18   [provider]    Family History Family History  Problem Relation Age of Onset  . Uterine cancer Mother   . Breast cancer Mother   . Heart attack Mother   . CAD Mother 65       bypass x 3  . Alzheimer's disease Father   . Macular degeneration Father     Social History Social History   Tobacco Use  . Smoking status: Never Smoker  . Smokeless tobacco: Never Used  Substance Use Topics  . Alcohol use: Never    Frequency: Never  . Drug use: Never     Allergies   Contrast media [iodinated diagnostic agents]; Dipyridamole; and Tramadol   Review of Systems Review of Systems  Musculoskeletal:       Left knee pain.  Skin:  Redness & irritation underneath the breasts, pannus, groin.   Physical Exam Triage Vital Signs ED Triage Vitals  Enc Vitals Group     BP 04/25/18 1445 (!) 157/74     Pulse Rate 04/25/18 1445 (!) 116     Resp 04/25/18 1445 16     Temp 04/25/18 1445 98.2 F (36.8 C)     Temp Source 04/25/18 1445 Oral     SpO2 04/25/18 1445 96 %     Weight 04/25/18 1444 245 lb (111.1 kg)     Height 04/25/18 1444 5\' 7"  (1.702 m)     Head Circumference --      Peak Flow --      Pain Score 04/25/18 1443 5     Pain Loc --      Pain Edu? --      Excl. in [place]? --    Updated Vital Signs BP (!) 157/74 (BP Location: Left Arm)   Pulse (!) 116   Temp 98.2 F (36.8 C) (Oral)   Resp 16   Ht 5\' 7"  (1.702 m)   Wt 111.1 kg    SpO2 96%   BMI 38.37 kg/m   Visual Acuity Right Eye Distance:   Left Eye Distance:   Bilateral Distance:    Right Eye Near:   Left Eye Near:    Bilateral Near:     Physical Exam Vitals signs and nursing note reviewed.  Constitutional:      General: She is not in acute distress.    Appearance: Normal appearance. She is obese.     Comments: Room smells of foul body odor.  HENT:     Head: Normocephalic and atraumatic.  Eyes:     General:        Right eye: No discharge.        Left eye: No discharge.     Conjunctiva/sclera: Conjunctivae normal.  Cardiovascular:     Rate and Rhythm: Tachycardia present.     Comments: Irregularly irregular. Pulmonary:     Effort: Pulmonary effort is normal.     Breath sounds: Normal breath sounds.  Skin:    Comments: Erythema and maceration of the breasts, left greater than right, under the abdominal pannus and in the inguinal region/perineal region.  Neurological:     Mental Status: She is alert.  Psychiatric:        Mood and Affect: Mood normal.        Behavior: Behavior normal.    UC Treatments / Results  Labs (all labs ordered are listed, but only abnormal results are displayed) Labs Reviewed - No data to display  EKG None  Radiology Dg Knee Complete 4 Views Left  Result Date: 04/25/2018 CLINICAL DATA:  Left knee pain for 2 days, no known injury, initial encounter EXAM: LEFT KNEE - COMPLETE 4+ VIEW COMPARISON:  None. FINDINGS: Significant tricompartmental degenerative changes are noted. Minimal joint effusion is seen. No acute fracture or dislocation is noted. IMPRESSION: Tricompartmental degenerative change with bone-on-bone contact and small joint effusion. No acute abnormality is noted. Electronically Signed   By: Inez Catalina M.D.   On: 04/25/2018 15:47    Procedures Procedures (including critical care time)  Medications Ordered in UC Medications - No data to display  Initial Impression / Assessment and Plan / UC Course    I have reviewed the triage vital signs and the nursing notes.  Pertinent labs & imaging results that were available during my care of the patient were reviewed by  me and considered in my medical decision making (see chart for details).    73 year old female presents with left knee pain which is secondary to severe osteoarthritis.  Advised to see orthopedic surgery.  Will likely need knee replacement.  Patient also has intertrigo.  Treating with oral Diflucan.    Final Clinical Impressions(s) / UC Diagnoses   Final diagnoses:  Primary osteoarthritis of left knee  Intertrigo     Discharge Instructions     You need to see an orthopedic surgeon about the knee. [facility] or [facility].  Medication as prescribed.  Take care  Dr. Lacinda Axon    ED Prescriptions    Medication Sig Dispense Auth. Provider   fluconazole (DIFLUCAN) 150 MG tablet Take 1 tablet (150 mg total) by mouth once a week. For 4 weeks 4 tablet Coral Spikes, DO     Controlled Substance Prescriptions [place] Controlled Substance Registry consulted? Not Applicable   Coral Spikes, DO 04/25/18 1641

## 2018-04-25 NOTE — Discharge Instructions (Signed)
You need to see an orthopedic surgeon about the knee. [facility] or [facility].  Medication as prescribed.  Take care  Dr. Lacinda Axon

## 2019-01-25 ENCOUNTER — Other Ambulatory Visit: Payer: Self-pay

## 2019-01-25 ENCOUNTER — Emergency Department: Payer: Medicare HMO

## 2019-01-25 ENCOUNTER — Encounter: Payer: Self-pay | Admitting: Emergency Medicine

## 2019-01-25 ENCOUNTER — Emergency Department
Admission: EM | Admit: 2019-01-25 | Discharge: 2019-01-26 | Disposition: A | Discharge status: Still In Hospital | Payer: Medicare HMO | Attending: Emergency Medicine | Admitting: Emergency Medicine

## 2019-01-25 ENCOUNTER — Encounter
Admission: EM | Disposition: A | Discharge status: Still In Hospital | Payer: Self-pay | Source: Home / Self Care | Attending: Emergency Medicine

## 2019-01-25 DIAGNOSIS — I213 ST elevation (STEMI) myocardial infarction of unspecified site: Secondary | ICD-10-CM

## 2019-01-25 DIAGNOSIS — E785 Hyperlipidemia, unspecified: Secondary | ICD-10-CM | POA: Diagnosis not present

## 2019-01-25 DIAGNOSIS — Z79899 Other long term (current) drug therapy: Secondary | ICD-10-CM | POA: Diagnosis not present

## 2019-01-25 DIAGNOSIS — Z8543 Personal history of malignant neoplasm of ovary: Secondary | ICD-10-CM | POA: Insufficient documentation

## 2019-01-25 DIAGNOSIS — E119 Type 2 diabetes mellitus without complications: Secondary | ICD-10-CM | POA: Insufficient documentation

## 2019-01-25 DIAGNOSIS — R4182 Altered mental status, unspecified: Secondary | ICD-10-CM

## 2019-01-25 DIAGNOSIS — R570 Cardiogenic shock: Secondary | ICD-10-CM | POA: Diagnosis not present

## 2019-01-25 DIAGNOSIS — M1990 Unspecified osteoarthritis, unspecified site: Secondary | ICD-10-CM | POA: Diagnosis not present

## 2019-01-25 DIAGNOSIS — I2109 ST elevation (STEMI) myocardial infarction involving other coronary artery of anterior wall: Secondary | ICD-10-CM | POA: Diagnosis not present

## 2019-01-25 DIAGNOSIS — I4891 Unspecified atrial fibrillation: Secondary | ICD-10-CM | POA: Insufficient documentation

## 2019-01-25 DIAGNOSIS — I10 Essential (primary) hypertension: Secondary | ICD-10-CM | POA: Insufficient documentation

## 2019-01-25 DIAGNOSIS — I429 Cardiomyopathy, unspecified: Secondary | ICD-10-CM | POA: Insufficient documentation

## 2019-01-25 DIAGNOSIS — Z20828 Contact with and (suspected) exposure to other viral communicable diseases: Secondary | ICD-10-CM | POA: Insufficient documentation

## 2019-01-25 DIAGNOSIS — Z7901 Long term (current) use of anticoagulants: Secondary | ICD-10-CM | POA: Diagnosis not present

## 2019-01-25 DIAGNOSIS — I2510 Atherosclerotic heart disease of native coronary artery without angina pectoris: Secondary | ICD-10-CM | POA: Insufficient documentation

## 2019-01-25 HISTORY — PX: CORONARY/GRAFT ACUTE MI REVASCULARIZATION: CATH118305

## 2019-01-25 HISTORY — PX: IABP INSERTION: CATH118242

## 2019-01-25 HISTORY — PX: LEFT HEART CATH AND CORONARY ANGIOGRAPHY: CATH118249

## 2019-01-25 LAB — CBC WITH DIFFERENTIAL/PLATELET
Abs Immature Granulocytes: 0.04 10*3/uL (ref 0.00–0.07)
Basophils Absolute: 0 10*3/uL (ref 0.0–0.1)
Basophils Relative: 0 %
Eosinophils Absolute: 0.1 10*3/uL (ref 0.0–0.5)
Eosinophils Relative: 1 %
HCT: 44 % (ref 36.0–46.0)
Hemoglobin: 14.9 g/dL (ref 12.0–15.0)
Immature Granulocytes: 0 %
Lymphocytes Relative: 18 %
Lymphs Abs: 1.8 10*3/uL (ref 0.7–4.0)
MCH: 33 pg (ref 26.0–34.0)
MCHC: 33.9 g/dL (ref 30.0–36.0)
MCV: 97.3 fL (ref 80.0–100.0)
Monocytes Absolute: 0.5 10*3/uL (ref 0.1–1.0)
Monocytes Relative: 5 %
Neutro Abs: 7.4 10*3/uL (ref 1.7–7.7)
Neutrophils Relative %: 76 %
Platelets: 145 10*3/uL — ABNORMAL LOW (ref 150–400)
RBC: 4.52 MIL/uL (ref 3.87–5.11)
RDW: 12.6 % (ref 11.5–15.5)
WBC: 9.8 10*3/uL (ref 4.0–10.5)
nRBC: 0 % (ref 0.0–0.2)

## 2019-01-25 LAB — COMPREHENSIVE METABOLIC PANEL
ALT: 17 U/L (ref 0–44)
AST: 31 U/L (ref 15–41)
Albumin: 2.8 g/dL — ABNORMAL LOW (ref 3.5–5.0)
Alkaline Phosphatase: 84 U/L (ref 38–126)
Anion gap: 13 (ref 5–15)
BUN: 20 mg/dL (ref 8–23)
CO2: 16 mmol/L — ABNORMAL LOW (ref 22–32)
Calcium: 8.4 mg/dL — ABNORMAL LOW (ref 8.9–10.3)
Chloride: 112 mmol/L — ABNORMAL HIGH (ref 98–111)
Creatinine, Ser: 0.91 mg/dL (ref 0.44–1.00)
GFR calc Af Amer: >60 mL/min (ref >60)
GFR calc non Af Amer: >60 mL/min (ref >60)
Glucose, Bld: 294 mg/dL — ABNORMAL HIGH (ref 70–99)
Potassium: 3.5 mmol/L (ref 3.5–5.1)
Sodium: 141 mmol/L (ref 135–145)
Total Bilirubin: 1.4 mg/dL — ABNORMAL HIGH (ref 0.3–1.2)
Total Protein: 5.6 g/dL — ABNORMAL LOW (ref 6.5–8.1)

## 2019-01-25 LAB — LIPID PANEL
Cholesterol: 184 mg/dL (ref 0–200)
HDL: 40 mg/dL — ABNORMAL LOW (ref >40)
LDL Cholesterol: 118 mg/dL — ABNORMAL HIGH (ref 0–99)
Total CHOL/HDL Ratio: 4.6 RATIO
Triglycerides: 128 mg/dL (ref <150)
VLDL: 26 mg/dL (ref 0–40)

## 2019-01-25 LAB — SARS CORONAVIRUS 2 BY RT PCR (HOSPITAL ORDER, PERFORMED IN ~~LOC~~ HOSPITAL LAB): SARS Coronavirus 2: NEGATIVE

## 2019-01-25 LAB — POCT ACTIVATED CLOTTING TIME
Activated Clotting Time: 412 seconds
Activated Clotting Time: >1000 seconds

## 2019-01-25 LAB — ETHANOL: Alcohol, Ethyl (B): <10 mg/dL (ref <10)

## 2019-01-25 LAB — PROTIME-INR
INR: 1.1 (ref 0.8–1.2)
Prothrombin Time: 14.5 seconds (ref 11.4–15.2)

## 2019-01-25 LAB — APTT: aPTT: <24 seconds — ABNORMAL LOW (ref 24–36)

## 2019-01-25 LAB — TROPONIN I (HIGH SENSITIVITY): Troponin I (High Sensitivity): 49 ng/L — ABNORMAL HIGH (ref <18)

## 2019-01-25 SURGERY — CORONARY/GRAFT ACUTE MI REVASCULARIZATION
Anesthesia: Moderate Sedation

## 2019-01-25 MED ORDER — HEPARIN (PORCINE) IN NACL 1000-0.9 UT/500ML-% IV SOLN
INTRAVENOUS | Status: AC
  Filled 2019-01-25: qty 1000

## 2019-01-25 MED ORDER — METHYLPREDNISOLONE SODIUM SUCC 125 MG IJ SOLR
INTRAMUSCULAR | Status: AC
  Filled 2019-01-25: qty 2

## 2019-01-25 MED ORDER — DEXMEDETOMIDINE HCL IN NACL 400 MCG/100ML IV SOLN
0.4000 ug/kg/h | INTRAVENOUS | Status: DC
  Administered 2019-01-25: 0.4 ug/kg/h via INTRAVENOUS
  Filled 2019-01-25: qty 100

## 2019-01-25 MED ORDER — SODIUM BICARBONATE 8.4 % IV SOLN
INTRAVENOUS | Status: AC
  Filled 2019-01-25: qty 50

## 2019-01-25 MED ORDER — HEPARIN SODIUM (PORCINE) 1000 UNIT/ML IJ SOLN
INTRAMUSCULAR | Status: DC | PRN
  Administered 2019-01-25: 5000 [IU] via INTRAVENOUS

## 2019-01-25 MED ORDER — DIPHENHYDRAMINE HCL 50 MG/ML IJ SOLN
INTRAMUSCULAR | Status: DC | PRN
  Administered 2019-01-25: 25 mg via INTRAVENOUS

## 2019-01-25 MED ORDER — TIROFIBAN HCL IN NACL 5-0.9 MG/100ML-% IV SOLN
INTRAVENOUS | Status: AC | PRN
  Administered 2019-01-25 (×2): 0.15 ug/kg/min via INTRAVENOUS

## 2019-01-25 MED ORDER — TIROFIBAN HCL IN NACL 5-0.9 MG/100ML-% IV SOLN
INTRAVENOUS | Status: AC
  Filled 2019-01-25: qty 100

## 2019-01-25 MED ORDER — SODIUM CHLORIDE 0.9 % IV BOLUS
1000.0000 mL | Freq: Once | INTRAVENOUS | Status: AC
  Administered 2019-01-25: 1000 mL via INTRAVENOUS

## 2019-01-25 MED ORDER — HEPARIN (PORCINE) 25000 UT/250ML-% IV SOLN
INTRAVENOUS | Status: AC | PRN
  Administered 2019-01-25: 1000 [IU]/h via INTRAVENOUS

## 2019-01-25 MED ORDER — METHYLPREDNISOLONE SODIUM SUCC 125 MG IJ SOLR
INTRAMUSCULAR | Status: DC | PRN
  Administered 2019-01-25: 125 mg via INTRAVENOUS

## 2019-01-25 MED ORDER — SODIUM CHLORIDE 0.9 % IV SOLN
INTRAVENOUS | Status: DC | PRN
  Administered 2019-01-25 (×2): 1.75 mg/kg/h via INTRAVENOUS

## 2019-01-25 MED ORDER — AMIODARONE HCL IN DEXTROSE 360-4.14 MG/200ML-% IV SOLN
INTRAVENOUS | Status: AC
  Filled 2019-01-25: qty 200

## 2019-01-25 MED ORDER — AMIODARONE IV BOLUS ONLY 150 MG/100ML
INTRAVENOUS | Status: AC
  Administered 2019-01-25: 150 mg via INTRAVENOUS
  Filled 2019-01-25: qty 100

## 2019-01-25 MED ORDER — NOREPINEPHRINE 4 MG/250ML-% IV SOLN
INTRAVENOUS | Status: AC
  Administered 2019-01-25: 10 ug/min via INTRAVENOUS
  Filled 2019-01-25: qty 250

## 2019-01-25 MED ORDER — LORAZEPAM 2 MG/ML IJ SOLN: 1.0000 mg | Freq: Once | INTRAMUSCULAR | Status: DC

## 2019-01-25 MED ORDER — AMIODARONE HCL IN DEXTROSE 360-4.14 MG/200ML-% IV SOLN
INTRAVENOUS | Status: AC
  Administered 2019-01-25 (×2): 59.4 mg/h
  Filled 2019-01-25: qty 200

## 2019-01-25 MED ORDER — AMIODARONE HCL 150 MG/3ML IV SOLN
INTRAVENOUS | Status: DC | PRN
  Administered 2019-01-25: 150 mg via INTRAVENOUS

## 2019-01-25 MED ORDER — LORAZEPAM 2 MG/ML IJ SOLN
INTRAMUSCULAR | Status: AC
  Filled 2019-01-25: qty 1

## 2019-01-25 MED ORDER — HEPARIN (PORCINE) IN NACL 1000-0.9 UT/500ML-% IV SOLN
INTRAVENOUS | Status: AC
  Filled 2019-01-25: qty 500

## 2019-01-25 MED ORDER — HEPARIN SODIUM (PORCINE) 1000 UNIT/ML IJ SOLN
INTRAMUSCULAR | Status: AC
  Filled 2019-01-25: qty 1

## 2019-01-25 MED ORDER — VERAPAMIL HCL 2.5 MG/ML IV SOLN
INTRAVENOUS | Status: AC
  Filled 2019-01-25: qty 2

## 2019-01-25 MED ORDER — DOPAMINE-DEXTROSE 3.2-5 MG/ML-% IV SOLN
INTRAVENOUS | Status: AC | PRN
  Administered 2019-01-25: 5 ug/kg/min via INTRAVENOUS

## 2019-01-25 MED ORDER — DOPAMINE-DEXTROSE 3.2-5 MG/ML-% IV SOLN
INTRAVENOUS | Status: AC
  Filled 2019-01-25: qty 250

## 2019-01-25 MED ORDER — BIVALIRUDIN BOLUS VIA INFUSION - CUPID
INTRAVENOUS | Status: DC | PRN
  Administered 2019-01-25: 95.25 mg via INTRAVENOUS

## 2019-01-25 MED ORDER — ETOMIDATE 2 MG/ML IV SOLN
INTRAVENOUS | Status: DC | PRN
  Administered 2019-01-25: 15 mg via INTRAVENOUS

## 2019-01-25 MED ORDER — SODIUM BICARBONATE 8.4 % IV SOLN
INTRAVENOUS | Status: DC | PRN
  Administered 2019-01-25: 50 meq via INTRAVENOUS

## 2019-01-25 MED ORDER — DOBUTAMINE IN D5W 4-5 MG/ML-% IV SOLN
INTRAVENOUS | Status: AC | PRN
  Administered 2019-01-25: 7.5 ug/kg/min via INTRAVENOUS

## 2019-01-25 MED ORDER — AMIODARONE IV BOLUS ONLY 150 MG/100ML
150.0000 mg | Freq: Once | INTRAVENOUS | Status: AC
  Administered 2019-01-25: 19:00:00 150 mg via INTRAVENOUS

## 2019-01-25 MED ORDER — BIVALIRUDIN TRIFLUOROACETATE 250 MG IV SOLR
INTRAVENOUS | Status: AC
  Filled 2019-01-25: qty 250

## 2019-01-25 MED ORDER — SODIUM CHLORIDE 0.9 % IV SOLN
INTRAVENOUS | Status: AC | PRN
  Administered 2019-01-25: 200 mL/h via INTRAVENOUS

## 2019-01-25 MED ORDER — HEPARIN (PORCINE) 25000 UT/250ML-% IV SOLN
INTRAVENOUS | Status: AC
  Filled 2019-01-25: qty 250

## 2019-01-25 MED ORDER — NOREPINEPHRINE BITARTRATE 1 MG/ML IV SOLN
INTRAVENOUS | Status: AC | PRN
  Administered 2019-01-25: 40 ug/min via INTRAVENOUS
  Administered 2019-01-25: 20 ug/min via INTRAVENOUS

## 2019-01-25 MED ORDER — TIROFIBAN (AGGRASTAT) BOLUS VIA INFUSION
INTRAVENOUS | Status: DC | PRN
  Administered 2019-01-25: 3175 ug via INTRAVENOUS

## 2019-01-25 MED ORDER — NOREPINEPHRINE 4 MG/250ML-% IV SOLN
0.0000 ug/min | INTRAVENOUS | Status: DC
  Administered 2019-01-25: 19:00:00 10 ug/min via INTRAVENOUS

## 2019-01-25 MED ORDER — ROCURONIUM BROMIDE 50 MG/5ML IV SOLN
INTRAVENOUS | Status: DC | PRN
  Administered 2019-01-25: 100 mg via INTRAVENOUS

## 2019-01-25 MED ORDER — DIPHENHYDRAMINE HCL 50 MG/ML IJ SOLN
INTRAMUSCULAR | Status: AC
  Filled 2019-01-25: qty 1

## 2019-01-25 SURGICAL SUPPLY — 23 items
BALLN IABP SENSA PLUS 8F 50CC (BALLOONS) ×3
BALLN TREK RX 2.25X15 (BALLOONS) ×3
BALLN ~~LOC~~ EUPHORA RX 3.25X15 (BALLOONS) ×3
BALLOON IABP SENS PLUS 8F 50CC (BALLOONS) ×1 IMPLANT
BALLOON TREK RX 2.25X15 (BALLOONS) ×1 IMPLANT
BALLOON ~~LOC~~ EUPHORA RX 3.25X15 (BALLOONS) ×1 IMPLANT
CATH INFINITI 5FR ANG PIGTAIL (CATHETERS) ×3 IMPLANT
CATH INFINITI JR4 5F (CATHETERS) ×3 IMPLANT
CATH VISTA GUIDE 6FR JL3.5 (CATHETERS) ×3 IMPLANT
CATH VISTA GUIDE 6FR XB3.5 (CATHETERS) ×3 IMPLANT
CATH VISTA GUIDE 6FR XB4 (CATHETERS) ×3 IMPLANT
DEVICE INFLAT 30 PLUS (MISCELLANEOUS) ×3 IMPLANT
GLIDESHEATH SLEND SS 6F .021 (SHEATH) ×3 IMPLANT
KIT MANI 3VAL PERCEP (MISCELLANEOUS) ×3 IMPLANT
KIT TRANSPAC II SGL 4260605 (MISCELLANEOUS) ×3 IMPLANT
NEEDLE PERC 18GX7CM (NEEDLE) ×3 IMPLANT
PACK CARDIAC CATH (CUSTOM PROCEDURE TRAY) ×3 IMPLANT
SHEATH AVANTI 6FR X 11CM (SHEATH) ×3 IMPLANT
STENT RESOLUTE ONYX 3.0X15 (Permanent Stent) ×3 IMPLANT
WIRE ASAHI PROWATER 180CM (WIRE) ×6 IMPLANT
WIRE GUIDERIGHT .035X150 (WIRE) ×3 IMPLANT
WIRE ROSEN-J .035X260CM (WIRE) ×3 IMPLANT
WIRE RUNTHROUGH .014X180CM (WIRE) ×3 IMPLANT

## 2019-01-25 NOTE — ED Triage Notes (Signed)
Pt presents to ED via [facility] as [facility] from hotel where she is staying with husband. EMS report pt is usually able to transfer to bathroom with wheelchair, was found unresponsive by family. Pt arrives confused, unable to follow commands. Significant yeast noted under bil breasts, family states she has had no treatment for this.

## 2019-01-25 NOTE — ED Notes (Signed)
Unable to obtain accurate BP

## 2019-01-25 NOTE — ED Notes (Signed)
MDs [place] speaking to daughter about need for sedation and intubation d/t pt's confusion and combativeness.

## 2019-01-25 NOTE — ED Notes (Addendum)
Daughter at bedside with cardiology attempting to help calm patient. Pt continually calling out "help, help!" Does not respond to questions.

## 2019-01-25 NOTE — ED Provider Notes (Signed)
[facility] Emergency Department Provider Note   ____________________________________________   First MD Initiated Contact with Patient 01/25/19 1757     (approximate)  I have reviewed the triage vital signs and the nursing notes.   HISTORY  Chief Complaint Code STEMI    HPI Monica Mullins is a 73 y.o. female with past medical history of hypertension, hyperlipidemia, diabetes, and atrial fibrillation who presents to the ED for altered mental status and code STEMI.  History is limited given patient's altered mental status and no family present.  Per EMS, patient is wheelchair-bound and was wheeled to the bathroom, but when she returned family found her to have an acute change in her mental status.  She is reportedly A&O x3 at baseline but has been moaning and minimally responsive during transport.  EMS noted that she seemed to be having difficulty breathing with O2 sats in the 80s on room air, subsequently placed on nonrebreather.  She was not able to make any coherent complaints during transport or upon arrival to the ED, only yells out "help" continuously.  Prehospital EKG was concerning for STEMI and code STEMI was called prior to arrival.        Past Medical History:  Diagnosis Date   Arthritis    Diabetes mellitus without complication ([place])    Hyperlipidemia    Hypertension    Ovarian cancer ([place])     There are no active problems to display for this patient.   Past Surgical History:  Procedure Laterality Date   ABDOMINAL HYSTERECTOMY     APPENDECTOMY     FRACTURE SURGERY Left    arm   KNEE SURGERY Left    LAPAROSCOPIC OOPHERECTOMY      Prior to Admission medications   Medication Sig Start Date End Date Taking? Authorizing Provider  apixaban (ELIQUIS) 5 MG TABS tablet Take 1 tablet (5 mg total) by mouth 2 (two) times daily. 01/13/18   Lavonia Drafts, MD  celecoxib (CELEBREX) 100 MG capsule Take 1 capsule by mouth 2 (two) times  daily as needed.    [provider]  fexofenadine-pseudoephedrine (ALLEGRA-D) 60-120 MG 12 hr tablet Take by mouth.    [provider]  fluconazole (DIFLUCAN) 150 MG tablet Take 1 tablet (150 mg total) by mouth once a week. For 4 weeks 04/25/18   Coral Spikes, DO  furosemide (LASIX) 20 MG tablet Take 20 mg by mouth daily. 02/25/18   [provider]  lisinopril (PRINIVIL,ZESTRIL) 10 MG tablet Take by mouth. 09/29/17   [provider]  metoprolol tartrate (LOPRESSOR) 25 MG tablet Take 1 tablet (25 mg total) by mouth 2 (two) times daily. 01/13/18 01/13/19  Lavonia Drafts, MD  nystatin cream (MYCOSTATIN) Apply topically.    [provider]  venlafaxine XR (EFFEXOR-XR) 75 MG 24 hr capsule Take by mouth. 06/29/17   [provider]    Allergies Contrast media [iodinated diagnostic agents], Dipyridamole, and Tramadol  Family History  Problem Relation Age of Onset   Uterine cancer Mother    Breast cancer Mother    Heart attack Mother    CAD Mother 30       bypass x 3   Alzheimer's disease Father    Macular degeneration Father     Social History Social History   Tobacco Use   Smoking status: Never Smoker   Smokeless tobacco: Never Used  Substance Use Topics   Alcohol use: Never    Frequency: Never   Drug use:  Never    Review of Systems Unable to obtain secondary to altered mental status  ____________________________________________   PHYSICAL EXAM:  VITAL SIGNS: ED Triage Vitals  Enc Vitals Group     BP      Pulse      Resp      Temp      Temp src      SpO2      Weight      Height      Head Circumference      Peak Flow      Pain Score      Pain Loc      Pain Edu?      Excl. in [place]?     Constitutional: Alert with eyes closed, yelling out and occasionally thrashing Eyes: Conjunctivae are normal. Head: Atraumatic. Nose: No congestion/rhinnorhea. Mouth/Throat: Mucous membranes are moist. Neck: Normal  ROM Cardiovascular: Tachycardic, irregularly irregular rhythm. Grossly normal heart sounds. Respiratory: Tachypneic and in respiratory distress.  No retractions. Lungs CTAB. Gastrointestinal: Soft and nontender. No distention. Genitourinary: deferred Musculoskeletal: No lower extremity tenderness nor edema.  Cool and pale extremities. Neurologic: Moving all extremities equally with no obvious focal deficits. Skin:  Skin is warm, dry and intact.  Rash with skin breakdown noted underneath breasts bilaterally. Psychiatric: Mood and affect are normal. Speech and behavior are normal.  ____________________________________________   LABS (all labs ordered are listed, but only abnormal results are displayed)  Labs Reviewed  CBC WITH DIFFERENTIAL/PLATELET - Abnormal; Notable for the following components:      Result Value   Platelets 145 (*)    All other components within normal limits  APTT - Abnormal; Notable for the following components:   aPTT <24 (*)    All other components within normal limits  COMPREHENSIVE METABOLIC PANEL - Abnormal; Notable for the following components:   Chloride 112 (*)    CO2 16 (*)    Glucose, Bld 294 (*)    Calcium 8.4 (*)    Total Protein 5.6 (*)    Albumin 2.8 (*)    Total Bilirubin 1.4 (*)    All other components within normal limits  LIPID PANEL - Abnormal; Notable for the following components:   HDL 40 (*)    LDL Cholesterol 118 (*)    All other components within normal limits  TROPONIN I (HIGH SENSITIVITY) - Abnormal; Notable for the following components:   Troponin I (High Sensitivity) 49 (*)    All other components within normal limits  SARS CORONAVIRUS 2 BY RT PCR (HOSPITAL ORDER, [place] [facility])  PROTIME-INR  ETHANOL  URINALYSIS, COMPLETE (UACMP) WITH MICROSCOPIC  URINE DRUG SCREEN, QUALITATIVE ([facility] ONLY)  BLOOD GAS, ARTERIAL  TROPONIN I (HIGH SENSITIVITY)   ____________________________________________  EKG  ED  ECG REPORT I, Blake Divine, the attending physician, personally viewed and interpreted this ECG.   Date: 01/25/2019  EKG Time: 18:10  Rate: 130  Rhythm: atrial fibrillation, rate 130  Axis: RAD  Intervals:right bundle branch block  ST&T Change: ST elevation anteriorly with reciprocal changes inferiorly   PROCEDURES  Procedure(s) performed (including Critical Care):  .Critical Care Performed by: Blake Divine, MD Authorized by: Blake Divine, MD   Critical care provider statement:    Critical care time (minutes):  45   Critical care time was exclusive of:  Separately billable procedures and treating other patients and teaching time   Critical care was necessary to treat or prevent imminent or life-threatening deterioration of the  following conditions:  Shock and respiratory failure   Critical care was time spent personally by me on the following activities:  Discussions with consultants, evaluation of patient's response to treatment, examination of patient, ordering and performing treatments and interventions, ordering and review of laboratory studies, ordering and review of radiographic studies, pulse oximetry, re-evaluation of patient's condition, obtaining history from patient or surrogate and review of old charts   I assumed direction of critical care for this patient from another provider in my specialty: no   Procedure Name: Intubation Date/Time: 01/25/2019 7:29 PM Performed by: Blake Divine, MD Pre-anesthesia Checklist: Patient identified, Patient being monitored, Emergency Drugs available, Timeout performed and Suction available Oxygen Delivery Method: Non-rebreather mask Preoxygenation: Pre-oxygenation with 100% oxygen Induction Type: Rapid sequence Ventilation: Mask ventilation without difficulty Laryngoscope Size: Mac and 4 Grade View: Grade I Tube size: 7.5 mm Number of attempts: 1 Airway Equipment and Method: Video-laryngoscopy Placement Confirmation: ETT  inserted through vocal cords under direct vision,  CO2 detector and Breath sounds checked- equal and bilateral Secured at: 21 cm Tube secured with: ETT holder Dental Injury: Teeth and Oropharynx as per pre-operative assessment         ____________________________________________   INITIAL IMPRESSION / ASSESSMENT AND PLAN / ED COURSE       73 year old female presents to the ED via EMS with concern for code STEMI.  History is extremely limited as patient only yells out "Help! Help!".  Obtaining accurate vital signs and appropriate managing patient became difficult as she was clearly hypotensive with cool extremities and with her thrashing movements, maintaining IV access was difficult.  At this point, decision was made to intubate so that patient could be safely evaluated.  Patient's daughter was at the bedside and risk of intubating her explained to her and she agreed with plan.  Levophed was available at the bedside and she was noted to be hypotensive just after intubation, subsequently started Levophed infusion in addition to fluid bolus that was ongoing.  EKG did appear consistent with anterior STEMI, however PE remained on the differential given right axis deviation and new right bundle branch block.  Bedside echocardiogram showed depressed EF with normal size RV, more concerning for ACS rather than PE.  Patient did have brief run of V. tach and so was started on amiodarone in agreement with Dr. Saralyn Pilar of cardiology. Dr. Saralyn Pilar in agreement that most pressing issue at this time appears to be potential STEMI and patient will need to be transported to the Cath Lab first prior to potential CT head or CTA chest.  Her blood pressure was improved following fluids and start of Levophed drip and patient was subsequently transported to the Cath Lab.  It is possible that her severe illness has now contributed to a state of delirium, explaining her altered mental status.  Lower suspicion for sepsis  and no obvious indication for antibiotics at this time.  Case was discussed with Dr. Patsey Berthold of the ICU, who accepted patient for admission.      ____________________________________________   FINAL CLINICAL IMPRESSION(S) / ED DIAGNOSES  Final diagnoses:  ST elevation myocardial infarction (STEMI), unspecified artery ([place])  Cardiogenic shock ([place])  Altered mental status, unspecified altered mental status type     ED Discharge Orders    None       Note:  This document was prepared using Dragon voice recognition software and may include unintentional dictation errors.   Blake Divine, MD 01/25/19 2022

## 2019-01-25 NOTE — Consult Note (Signed)
[facility] Cardiology  CARDIOLOGY CONSULT NOTE  Patient ID: RONELLA MECKEL MRN: [id number] DOB/AGE: Sep 23, 1945 73 y.o.  Admit date: 01/25/2019 Referring Physician Charna Archer Primary Physician Gregary Signs Cardiologist Humphrey Rolls Reason for Consultation anterior STEMI and cardiogenic shock  HPI: 73 year old female referred for evaluation of anterior ST elevation myocardial infarction.  Patient was brought by family members after 1 to 2-day history of intermittent episodes of shortness of breath, with acute decompensation earlier this evening.  In the emergency room, the patient was nonconversational, unable to answer questions.  History was obtained through husband and daughters.  ECG revealed atrial fibrillation, with rapid ventricular rate, with ST elevation in leads V2 through V4.  After discussion with patient's daughter, we elected to proceed with emergent cardiac catheterization possible primary PCI.  Review of systems complete and found to be negative unless listed above     Past Medical History:  Diagnosis Date  . Arthritis   . Diabetes mellitus without complication ([place])   . Hyperlipidemia   . Hypertension   . Ovarian cancer [facility])     Past Surgical History:  Procedure Laterality Date  . ABDOMINAL HYSTERECTOMY    . APPENDECTOMY    . FRACTURE SURGERY Left    arm  . KNEE SURGERY Left   . LAPAROSCOPIC OOPHERECTOMY      Medications Prior to Admission  Medication Sig Dispense Refill Last Dose  . apixaban (ELIQUIS) 5 MG TABS tablet Take 1 tablet (5 mg total) by mouth 2 (two) times daily. 60 tablet 0   . celecoxib (CELEBREX) 100 MG capsule Take 1 capsule by mouth 2 (two) times daily as needed.     . fexofenadine-pseudoephedrine (ALLEGRA-D) 60-120 MG 12 hr tablet Take by mouth.     . fluconazole (DIFLUCAN) 150 MG tablet Take 1 tablet (150 mg total) by mouth once a week. For 4 weeks 4 tablet 0   . furosemide (LASIX) 20 MG tablet Take 20 mg by mouth daily.     Marland Kitchen lisinopril (PRINIVIL,ZESTRIL) 10  MG tablet Take by mouth.     . metoprolol tartrate (LOPRESSOR) 25 MG tablet Take 1 tablet (25 mg total) by mouth 2 (two) times daily. 60 tablet 0   . nystatin cream (MYCOSTATIN) Apply topically.     . venlafaxine XR (EFFEXOR-XR) 75 MG 24 hr capsule Take by mouth.      Social History   Socioeconomic History  . Marital status: Married    Spouse name: Not on file  . Number of children: Not on file  . Years of education: Not on file  . Highest education level: Not on file  Occupational History  . Not on file  Social Needs  . Financial resource strain: Not on file  . Food insecurity    Worry: Not on file    Inability: Not on file  . Transportation needs    Medical: Not on file    Non-medical: Not on file  Tobacco Use  . Smoking status: Never Smoker  . Smokeless tobacco: Never Used  Substance and Sexual Activity  . Alcohol use: Never    Frequency: Never  . Drug use: Never  . Sexual activity: Not on file  Lifestyle  . Physical activity    Days per week: Not on file    Minutes per session: Not on file  . Stress: Not on file  Relationships  . Social Herbalist on phone: Not on file    Gets together: Not on file  Attends religious service: Not on file    Active member of club or organization: Not on file    Attends meetings of clubs or organizations: Not on file    Relationship status: Not on file  . Intimate partner violence    Fear of current or ex partner: Not on file    Emotionally abused: Not on file    Physically abused: Not on file    Forced sexual activity: Not on file  Other Topics Concern  . Not on file  Social History Narrative  . Not on file    Family History  Problem Relation Age of Onset  . Uterine cancer Mother   . Breast cancer Mother   . Heart attack Mother   . CAD Mother 80       bypass x 3  . Alzheimer's disease Father   . Macular degeneration Father       Review of systems complete and found to be negative unless listed above       PHYSICAL EXAM  General: Well developed, well nourished, in no acute distress HEENT:  Normocephalic and atramatic Neck:  No JVD.  Lungs: Clear bilaterally to auscultation and percussion. Heart: HRRR . Normal S1 and S2 without gallops or murmurs.  Abdomen: Bowel sounds are positive, abdomen soft and non-tender  Msk:  Back normal, normal gait. Normal strength and tone for age. Extremities: No clubbing, cyanosis or edema.   Neuro: Alert and oriented X 3. Psych:  Good affect, responds appropriately  Labs:   Lab Results  Component Value Date   WBC 9.8 01/25/2019   HGB 14.9 01/25/2019   HCT 44.0 01/25/2019   MCV 97.3 01/25/2019   PLT 145 (L) 01/25/2019    Recent Labs  Lab 01/25/19 1811  NA 141  K 3.5  CL 112*  CO2 16*  BUN 20  CREATININE 0.91  CALCIUM 8.4*  PROT 5.6*  BILITOT 1.4*  ALKPHOS 84  ALT 17  AST 31  GLUCOSE 294*   Lab Results  Component Value Date   TROPONINI <0.03 01/13/2018    Lab Results  Component Value Date   CHOL 184 01/25/2019   Lab Results  Component Value Date   HDL 40 (L) 01/25/2019   Lab Results  Component Value Date   LDLCALC 118 (H) 01/25/2019   Lab Results  Component Value Date   TRIG 128 01/25/2019   Lab Results  Component Value Date   CHOLHDL 4.6 01/25/2019   No results found for: LDLDIRECT    Radiology: No results found.  EKG: Atrial fibrillation with rapid ventricular rate, 1 to 2 mm ST elevation in leads V2 through V4  ASSESSMENT AND PLAN:   1.  Acute anterior ST elevation myocardial infarction 2.  Cardiogenic shock  Recommendations  1.  Emergent cardiac catheterization and probable primary PCI  Signed: Isaias Cowman MD,PhD, [facility] 01/25/2019, 9:20 PM

## 2019-01-25 NOTE — Progress Notes (Signed)
   01/25/19 2200  Clinical Encounter Type  Visited With Family  Visit Type Initial  Referral From Nurse  Stress Factors  Family Stress Factors Lack of knowledge;Loss of control  Ch was paged for Code Stemi. Pt's daughter Monica Mullins was waiting outside the pt room. Ch escorted the Daughter to the waiting room while the pt underwent Catheratization. Ch kept her company until the pt was transferred to [facility] in [place].

## 2019-01-25 NOTE — ED Notes (Signed)
Cardiology at bedside.

## 2019-01-25 NOTE — Code Documentation (Signed)
Pt intubated by EDP [place] and RT Bambi, 22 @ lip

## 2019-01-25 NOTE — Consult Note (Signed)
NAME:  Monica Mullins, MRN:  [id number], DOB:  1945-06-08, LOS: 0 ADMISSION DATE:  (Not on file), CONSULTATION DATE:  11/26 REFERRING MD:  Dr. Haroldine Laws, CHIEF COMPLAINT:  Shock   Brief History   73 year old female admitted with STEMI and cardiogenic shock 11/26 after emergent cardiac cath with PCI to ostial/proximal LAD and IABP was placed. [facility] to [facility] after cath on vent and high dose pressors.   History of present illness   Patient is encephalopathic and/or intubated. Therefore history has been obtained from chart review.  72 year old female with PMH as below, which is significant for Atrial fibrillation on eliquis, DM, HTN, and HLD. She is wheelchair bound and is alert/oriented at baseline. In the PM hours of 11/26 while staying in hotel with her husband she was noted to have altered mental status. EMS was called and she was found to by hypoxemic requiring NRB. EKG en route to ED demonstrated ST elevation in V2 and V4. She came to [facility] ED as code STEMI. Confused and uncooperative with care in ED necessitating intubation for further care. Run of VT in ED and she was started on amiodarone.  Cardiology reviewed EKG and she was taken for emergent cardiac catheterization. In the cath lab she was found to have multivessel CAD, but LAD had an area of total occlusion and a DES was placed. She developed shock requiring levophed and dobutamine infusions. IABP also placed. LVEF estimated at 20%. She was transferred to [facility] ICU under the cardiology service and PCCM was consulted.   Past Medical History   has a past medical history of Arthritis, Diabetes mellitus without complication ([employer]), Hyperlipidemia, Hypertension, and Ovarian cancer ([place]).   Significant Hospital Events   11/26 admit for STEMI. Intubated. LHC with DES to LAD. AIBP. Transferred to [facility] on vent/pressors.   Consults:    Procedures:  ETT 11/26 > CVL 11/27 > Art line 11/26 > IABP 11/26 > Significant  Diagnostic Tests:  LHC 11/26 > RCA 30% stenosed, LCX 75% stenosed, LAD 100% stenosed. DES to LAD.  CT head 11/27 >  Micro Data:    Antimicrobials:     Interim history/subjective:    Objective   There were no vitals taken for this visit.    Vent Mode: AC FiO2 (%):  [40 %] 40 % Set Rate:  [18 bmp] 18 bmp Vt Set:  [450 mL] 450 mL PEEP:  [5 cmH20] 5 cmH20  No intake or output data in the 24 hours ending 01/25/19 2208 There were no vitals filed for this visit.  Examination: General: Morbidly obese female on vent HENT: [place]/AT, PERRL, no appreciable JVD Lungs: Coarse Cardiovascular: Borderline tachy, IRIR, no MRG Abdomen: Soft, non-tender, non-distended Extremities: No acute deformity. Multiple areas of bruising from previous needle sticks.  Neuro: sedated on vent, but does arouse to voice and follows simple commands.  GU: Vaginal bleeding  Resolved Hospital Problem list     Assessment & Plan:   Cardiogenic shock: in the setting of STEMI. EF estimated 20%. - Management per cardiology - Levophed, Dobutamine. MAP goal > 52mmHg - IABP - Place CVL to assess CVP and obtain mixed venous blood  STEMI: LAD 100% stenosis, s/p DES on 11/26. - Management per cardiology  Acute hypoxemic respiratory failure - Full vent support - CXR for ETT placement - Follow ABG - VAP bundle - Precedex with PRN fentanyl for RASS goal -1 to -2.   DM: - CBG monitoring and  SSI - Holding home metformin  Atrial fibrillation: on eliquis - Per cardiology - Amiodarone  Vaginal bleeding: in the setting of multiple anticoagulants - Check CBC, PT/INR - Abdominal US, unstable for CT  Best practice:  Diet:NPO Pain/Anxiety/Delirium protocol (if indicated): precedex, fentanyl VAP protocol (if indicated): Per protocol DVT prophylaxis: AC per cardiology GI prophylaxis: PPI Glucose control: SSI Mobility: BR Code Status: FULL: family tells me one round of CPR only.  Family Communication:  Daughters updated via phon 0200. Disposition: ICU  Labs   CBC: Recent Labs  Lab 01/25/19 1811  WBC 9.8  NEUTROABS 7.4  HGB 14.9  HCT 44.0  MCV 97.3  PLT 145*    Basic Metabolic Panel: Recent Labs  Lab 01/25/19 1811  NA 141  K 3.5  CL 112*  CO2 16*  GLUCOSE 294*  BUN 20  CREATININE 0.91  CALCIUM 8.4*   GFR: Estimated Creatinine Clearance: 75.1 mL/min (by C-G formula based on SCr of 0.91 mg/dL). Recent Labs  Lab 01/25/19 1811  WBC 9.8    Liver Function Tests: Recent Labs  Lab 01/25/19 1811  AST 31  ALT 17  ALKPHOS 84  BILITOT 1.4*  PROT 5.6*  ALBUMIN 2.8*   No results for input(s): LIPASE, AMYLASE in the last 168 hours. No results for input(s): AMMONIA in the last 168 hours.  ABG    Component Value Date/Time   PHART PENDING 01/25/2019 2132   PCO2ART 35 01/25/2019 2132   PO2ART PENDING 01/25/2019 2132   HCO3 13.4 (L) 01/25/2019 2132   ACIDBASEDEF 13.8 (H) 01/25/2019 2132   O2SAT 98.4 01/25/2019 2132     Coagulation Profile: Recent Labs  Lab 01/25/19 1811  INR 1.1    Cardiac Enzymes: No results for input(s): CKTOTAL, CKMB, CKMBINDEX, TROPONINI in the last 168 hours.  HbA1C: No results found for: HGBA1C  CBG: No results for input(s): GLUCAP in the last 168 hours.  Review of Systems:     Past Medical History  She,  has a past medical history of Arthritis, Diabetes mellitus without complication ([place]), Hyperlipidemia, Hypertension, and Ovarian cancer ([place]).   Surgical History    Past Surgical History:  Procedure Laterality Date  . ABDOMINAL HYSTERECTOMY    . APPENDECTOMY    . FRACTURE SURGERY Left    arm  . KNEE SURGERY Left   . LAPAROSCOPIC OOPHERECTOMY       Social History   reports that she has never smoked. She has never used smokeless tobacco. She reports that she does not drink alcohol or use drugs.   Family History   Her family history includes Alzheimer's disease in her father; Breast cancer in her mother; CAD (age of  onset: 74) in her mother; Heart attack in her mother; Macular degeneration in her father; Uterine cancer in her mother.   Allergies Allergies  Allergen Reactions  . Contrast Media [Iodinated Diagnostic Agents]   . Dipyridamole Nausea Only and Nausea And Vomiting  . Tramadol Nausea Only     Home Medications  Prior to Admission medications   Medication Sig Start Date End Date Taking? Authorizing Provider  apixaban (ELIQUIS) 5 MG TABS tablet Take 1 tablet (5 mg total) by mouth 2 (two) times daily. 01/13/18   Lavonia Drafts, MD  celecoxib (CELEBREX) 100 MG capsule Take 1 capsule by mouth 2 (two) times daily as needed.    [provider]  fexofenadine-pseudoephedrine (ALLEGRA-D) 60-120 MG 12 hr tablet Take by mouth.    [provider]  fluconazole (DIFLUCAN)  150 MG tablet Take 1 tablet (150 mg total) by mouth once a week. For 4 weeks 04/25/18   Coral Spikes, DO  furosemide (LASIX) 20 MG tablet Take 20 mg by mouth daily. 02/25/18   [provider]  lisinopril (PRINIVIL,ZESTRIL) 10 MG tablet Take by mouth. 09/29/17   [provider]  metoprolol tartrate (LOPRESSOR) 25 MG tablet Take 1 tablet (25 mg total) by mouth 2 (two) times daily. 01/13/18 01/13/19  Lavonia Drafts, MD  nystatin cream (MYCOSTATIN) Apply topically.    [provider]  venlafaxine XR (EFFEXOR-XR) 75 MG 24 hr capsule Take by mouth. 06/29/17   [provider]     Critical care time: 45 min     Georgann Housekeeper, AGACNP-BC [place] for personal pager PCCM on call pager [phone]  2019-02-19 3:05 AM

## 2019-01-25 NOTE — ED Notes (Signed)
COVID swab done

## 2019-01-26 ENCOUNTER — Inpatient Hospital Stay (HOSPITAL_COMMUNITY): Payer: Medicare HMO

## 2019-01-26 ENCOUNTER — Encounter: Payer: Self-pay | Admitting: Cardiology

## 2019-01-26 DIAGNOSIS — E876 Hypokalemia: Secondary | ICD-10-CM | POA: Diagnosis present

## 2019-01-26 DIAGNOSIS — I480 Paroxysmal atrial fibrillation: Secondary | ICD-10-CM

## 2019-01-26 DIAGNOSIS — I2510 Atherosclerotic heart disease of native coronary artery without angina pectoris: Secondary | ICD-10-CM | POA: Diagnosis present

## 2019-01-26 DIAGNOSIS — E872 Acidosis: Secondary | ICD-10-CM | POA: Diagnosis present

## 2019-01-26 DIAGNOSIS — Z90710 Acquired absence of both cervix and uterus: Secondary | ICD-10-CM | POA: Diagnosis not present

## 2019-01-26 DIAGNOSIS — Z452 Encounter for adjustment and management of vascular access device: Secondary | ICD-10-CM

## 2019-01-26 DIAGNOSIS — Z993 Dependence on wheelchair: Secondary | ICD-10-CM | POA: Diagnosis not present

## 2019-01-26 DIAGNOSIS — I472 Ventricular tachycardia: Secondary | ICD-10-CM | POA: Diagnosis present

## 2019-01-26 DIAGNOSIS — F0390 Unspecified dementia without behavioral disturbance: Secondary | ICD-10-CM | POA: Diagnosis present

## 2019-01-26 DIAGNOSIS — E119 Type 2 diabetes mellitus without complications: Secondary | ICD-10-CM | POA: Diagnosis present

## 2019-01-26 DIAGNOSIS — N179 Acute kidney failure, unspecified: Secondary | ICD-10-CM | POA: Diagnosis present

## 2019-01-26 DIAGNOSIS — R570 Cardiogenic shock: Secondary | ICD-10-CM | POA: Diagnosis present

## 2019-01-26 DIAGNOSIS — Z6841 Body Mass Index (BMI) 40.0 and over, adult: Secondary | ICD-10-CM

## 2019-01-26 DIAGNOSIS — I2102 ST elevation (STEMI) myocardial infarction involving left anterior descending coronary artery: Secondary | ICD-10-CM | POA: Diagnosis present

## 2019-01-26 DIAGNOSIS — Z66 Do not resuscitate: Secondary | ICD-10-CM | POA: Diagnosis not present

## 2019-01-26 DIAGNOSIS — I213 ST elevation (STEMI) myocardial infarction of unspecified site: Secondary | ICD-10-CM

## 2019-01-26 DIAGNOSIS — Z803 Family history of malignant neoplasm of breast: Secondary | ICD-10-CM

## 2019-01-26 DIAGNOSIS — Z820 Family history of epilepsy and other diseases of the nervous system: Secondary | ICD-10-CM

## 2019-01-26 DIAGNOSIS — K7200 Acute and subacute hepatic failure without coma: Secondary | ICD-10-CM | POA: Diagnosis present

## 2019-01-26 DIAGNOSIS — R579 Shock, unspecified: Secondary | ICD-10-CM | POA: Diagnosis present

## 2019-01-26 DIAGNOSIS — Z515 Encounter for palliative care: Secondary | ICD-10-CM | POA: Diagnosis not present

## 2019-01-26 DIAGNOSIS — Z7984 Long term (current) use of oral hypoglycemic drugs: Secondary | ICD-10-CM

## 2019-01-26 DIAGNOSIS — N939 Abnormal uterine and vaginal bleeding, unspecified: Secondary | ICD-10-CM | POA: Diagnosis present

## 2019-01-26 DIAGNOSIS — I10 Essential (primary) hypertension: Secondary | ICD-10-CM | POA: Diagnosis present

## 2019-01-26 DIAGNOSIS — Z8249 Family history of ischemic heart disease and other diseases of the circulatory system: Secondary | ICD-10-CM

## 2019-01-26 DIAGNOSIS — Z955 Presence of coronary angioplasty implant and graft: Secondary | ICD-10-CM

## 2019-01-26 DIAGNOSIS — J9601 Acute respiratory failure with hypoxia: Secondary | ICD-10-CM | POA: Diagnosis present

## 2019-01-26 DIAGNOSIS — Z95811 Presence of heart assist device: Secondary | ICD-10-CM

## 2019-01-26 DIAGNOSIS — E785 Hyperlipidemia, unspecified: Secondary | ICD-10-CM | POA: Diagnosis present

## 2019-01-26 DIAGNOSIS — Z8543 Personal history of malignant neoplasm of ovary: Secondary | ICD-10-CM

## 2019-01-26 DIAGNOSIS — I340 Nonrheumatic mitral (valve) insufficiency: Secondary | ICD-10-CM | POA: Diagnosis not present

## 2019-01-26 DIAGNOSIS — I2109 ST elevation (STEMI) myocardial infarction involving other coronary artery of anterior wall: Secondary | ICD-10-CM | POA: Diagnosis present

## 2019-01-26 DIAGNOSIS — Z8049 Family history of malignant neoplasm of other genital organs: Secondary | ICD-10-CM

## 2019-01-26 DIAGNOSIS — Z91041 Radiographic dye allergy status: Secondary | ICD-10-CM

## 2019-01-26 DIAGNOSIS — Z7901 Long term (current) use of anticoagulants: Secondary | ICD-10-CM

## 2019-01-26 LAB — CBC WITH DIFFERENTIAL/PLATELET
Abs Immature Granulocytes: 0.15 10*3/uL — ABNORMAL HIGH (ref 0.00–0.07)
Basophils Absolute: 0 10*3/uL (ref 0.0–0.1)
Basophils Relative: 0 %
Eosinophils Absolute: 0 10*3/uL (ref 0.0–0.5)
Eosinophils Relative: 0 %
HCT: 42.2 % (ref 36.0–46.0)
Hemoglobin: 13.4 g/dL (ref 12.0–15.0)
Immature Granulocytes: 1 %
Lymphocytes Relative: 6 %
Lymphs Abs: 1 10*3/uL (ref 0.7–4.0)
MCH: 34 pg (ref 26.0–34.0)
MCHC: 31.8 g/dL (ref 30.0–36.0)
MCV: 107.1 fL — ABNORMAL HIGH (ref 80.0–100.0)
Monocytes Absolute: 0.5 10*3/uL (ref 0.1–1.0)
Monocytes Relative: 3 %
Neutro Abs: 15.9 10*3/uL — ABNORMAL HIGH (ref 1.7–7.7)
Neutrophils Relative %: 90 %
Platelets: 156 10*3/uL (ref 150–400)
RBC: 3.94 MIL/uL (ref 3.87–5.11)
RDW: 12.8 % (ref 11.5–15.5)
WBC: 17.6 10*3/uL — ABNORMAL HIGH (ref 4.0–10.5)
nRBC: 0 % (ref 0.0–0.2)

## 2019-01-26 LAB — POCT I-STAT 7, (LYTES, BLD GAS, ICA,H+H)
Acid-base deficit: 16 mmol/L — ABNORMAL HIGH (ref 0.0–2.0)
Acid-base deficit: 16 mmol/L — ABNORMAL HIGH (ref 0.0–2.0)
Bicarbonate: 11.3 mmol/L — ABNORMAL LOW (ref 20.0–28.0)
Bicarbonate: 10.7 mmol/L — ABNORMAL LOW (ref 20.0–28.0)
Calcium, Ion: 1.04 mmol/L — ABNORMAL LOW (ref 1.15–1.40)
Calcium, Ion: 1.04 mmol/L — ABNORMAL LOW (ref 1.15–1.40)
HCT: 40 % (ref 36.0–46.0)
HCT: 37 % (ref 36.0–46.0)
Hemoglobin: 13.6 g/dL (ref 12.0–15.0)
Hemoglobin: 12.6 g/dL (ref 12.0–15.0)
O2 Saturation: 95 %
O2 Saturation: 97 %
Patient temperature: 92.2
Patient temperature: 33.5
Potassium: 3.2 mmol/L — ABNORMAL LOW (ref 3.5–5.1)
Potassium: 3.2 mmol/L — ABNORMAL LOW (ref 3.5–5.1)
Sodium: 144 mmol/L (ref 135–145)
Sodium: 144 mmol/L (ref 135–145)
TCO2: 12 mmol/L — ABNORMAL LOW (ref 22–32)
TCO2: 12 mmol/L — ABNORMAL LOW (ref 22–32)
pCO2 arterial: 26.7 mmHg — ABNORMAL LOW (ref 32.0–48.0)
pCO2 arterial: 23.6 mmHg — ABNORMAL LOW (ref 32.0–48.0)
pH, Arterial: 7.213 — ABNORMAL LOW (ref 7.350–7.450)
pH, Arterial: 7.247 — ABNORMAL LOW (ref 7.350–7.450)
pO2, Arterial: 78 mmHg — ABNORMAL LOW (ref 83.0–108.0)
pO2, Arterial: 88 mmHg (ref 83.0–108.0)

## 2019-01-26 LAB — COMPREHENSIVE METABOLIC PANEL
ALT: 265 U/L — ABNORMAL HIGH (ref 0–44)
AST: 1264 U/L — ABNORMAL HIGH (ref 15–41)
Albumin: 2.2 g/dL — ABNORMAL LOW (ref 3.5–5.0)
Alkaline Phosphatase: 92 U/L (ref 38–126)
Anion gap: 17 — ABNORMAL HIGH (ref 5–15)
BUN: 19 mg/dL (ref 8–23)
CO2: 14 mmol/L — ABNORMAL LOW (ref 22–32)
Calcium: 7.6 mg/dL — ABNORMAL LOW (ref 8.9–10.3)
Chloride: 112 mmol/L — ABNORMAL HIGH (ref 98–111)
Creatinine, Ser: 1.31 mg/dL — ABNORMAL HIGH (ref 0.44–1.00)
GFR calc Af Amer: 46 mL/min — ABNORMAL LOW (ref >60)
GFR calc non Af Amer: 40 mL/min — ABNORMAL LOW (ref >60)
Glucose, Bld: 410 mg/dL — ABNORMAL HIGH (ref 70–99)
Potassium: 3.3 mmol/L — ABNORMAL LOW (ref 3.5–5.1)
Sodium: 143 mmol/L (ref 135–145)
Total Bilirubin: 1.8 mg/dL — ABNORMAL HIGH (ref 0.3–1.2)
Total Protein: 4.4 g/dL — ABNORMAL LOW (ref 6.5–8.1)

## 2019-01-26 LAB — HEMOGLOBIN A1C
Hgb A1c MFr Bld: 6.2 % — ABNORMAL HIGH (ref 4.8–5.6)
Mean Plasma Glucose: 131.24 mg/dL

## 2019-01-26 LAB — PROTIME-INR
INR: 2.9 — ABNORMAL HIGH (ref 0.8–1.2)
Prothrombin Time: 30 seconds — ABNORMAL HIGH (ref 11.4–15.2)

## 2019-01-26 LAB — GLUCOSE, CAPILLARY: Glucose-Capillary: 372 mg/dL — ABNORMAL HIGH (ref 70–99)

## 2019-01-26 LAB — BRAIN NATRIURETIC PEPTIDE: B Natriuretic Peptide: 262.9 pg/mL — ABNORMAL HIGH (ref 0.0–100.0)

## 2019-01-26 LAB — COOXEMETRY PANEL
Carboxyhemoglobin: 0.3 % — ABNORMAL LOW (ref 0.5–1.5)
Methemoglobin: 0.8 % (ref 0.0–1.5)
O2 Saturation: 73.9 %
Total hemoglobin: 13.4 g/dL (ref 12.0–16.0)

## 2019-01-26 LAB — TROPONIN I (HIGH SENSITIVITY)
Troponin I (High Sensitivity): >27000 ng/L (ref <18)
Troponin I (High Sensitivity): >27000 ng/L (ref <18)

## 2019-01-26 LAB — LACTIC ACID, PLASMA
Lactic Acid, Venous: 10.1 mmol/L (ref 0.5–1.9)
Lactic Acid, Venous: >11 mmol/L (ref 0.5–1.9)
Lactic Acid, Venous: >11 mmol/L (ref 0.5–1.9)

## 2019-01-26 LAB — APTT: aPTT: >200 seconds (ref 24–36)

## 2019-01-26 LAB — ECHOCARDIOGRAM COMPLETE

## 2019-01-26 LAB — MRSA PCR SCREENING: MRSA by PCR: NEGATIVE

## 2019-01-26 LAB — MAGNESIUM: Magnesium: 1.5 mg/dL — ABNORMAL LOW (ref 1.7–2.4)

## 2019-01-26 MED ORDER — POTASSIUM CHLORIDE 10 MEQ/50ML IV SOLN
10.0000 meq | INTRAVENOUS | Status: AC
  Administered 2019-01-26 (×2): 10 meq via INTRAVENOUS
  Filled 2019-01-26: qty 50

## 2019-01-26 MED ORDER — AMIODARONE HCL IN DEXTROSE 360-4.14 MG/200ML-% IV SOLN
60.0000 mg/h | INTRAVENOUS | Status: AC
  Filled 2019-01-26: qty 200

## 2019-01-26 MED ORDER — GLYCOPYRROLATE 0.2 MG/ML IJ SOLN
0.2000 mg | INTRAMUSCULAR | Status: DC | PRN
  Filled 2019-01-26: qty 1

## 2019-01-26 MED ORDER — ASPIRIN 81 MG PO CHEW
81.0000 mg | CHEWABLE_TABLET | Freq: Every day | ORAL | Status: DC
  Filled 2019-01-26: qty 1

## 2019-01-26 MED ORDER — AMIODARONE HCL IN DEXTROSE 360-4.14 MG/200ML-% IV SOLN
30.0000 mg/h | INTRAVENOUS | Status: DC
  Administered 2019-01-26: 30 mg/h via INTRAVENOUS

## 2019-01-26 MED ORDER — NOREPINEPHRINE 4 MG/250ML-% IV SOLN
INTRAVENOUS | Status: AC
  Filled 2019-01-26: qty 250

## 2019-01-26 MED ORDER — NOREPINEPHRINE 4 MG/250ML-% IV SOLN
INTRAVENOUS | Status: AC
  Administered 2019-01-26: 02:00:00
  Filled 2019-01-26: qty 250

## 2019-01-26 MED ORDER — MORPHINE BOLUS VIA INFUSION
5.0000 mg | Freq: Once | INTRAVENOUS | Status: DC
  Filled 2019-01-26: qty 5

## 2019-01-26 MED ORDER — SODIUM CHLORIDE 0.9 % IV SOLN
INTRAVENOUS | Status: DC | PRN
  Administered 2019-01-26: 250 mL via INTRAVENOUS

## 2019-01-26 MED ORDER — SODIUM BICARBONATE 8.4 % IV SOLN: 100.0000 meq | Freq: Once | INTRAVENOUS | Status: DC

## 2019-01-26 MED ORDER — MIDAZOLAM HCL 2 MG/2ML IJ SOLN: 1.0000 mg | Freq: Once | INTRAMUSCULAR | Status: DC

## 2019-01-26 MED ORDER — FENTANYL CITRATE (PF) 100 MCG/2ML IJ SOLN
25.0000 ug | INTRAMUSCULAR | Status: DC | PRN
  Administered 2019-01-26 (×2): 100 ug via INTRAVENOUS
  Administered 2019-01-26: 50 ug via INTRAVENOUS
  Filled 2019-01-26 (×2): qty 2

## 2019-01-26 MED ORDER — ATORVASTATIN CALCIUM 40 MG PO TABS: 40.0000 mg | ORAL_TABLET | Freq: Every day | ORAL | Status: DC

## 2019-01-26 MED ORDER — ONDANSETRON HCL 4 MG/2ML IJ SOLN: 4.0000 mg | Freq: Four times a day (QID) | INTRAMUSCULAR | Status: DC | PRN

## 2019-01-26 MED ORDER — HALOPERIDOL 0.5 MG PO TABS
0.5000 mg | ORAL_TABLET | ORAL | Status: DC | PRN
  Filled 2019-01-26: qty 1

## 2019-01-26 MED ORDER — NOREPINEPHRINE 16 MG/250ML-% IV SOLN
0.0000 ug/min | INTRAVENOUS | Status: DC
  Administered 2019-01-26: 70 ug/min via INTRAVENOUS
  Administered 2019-01-26: 65 ug/min via INTRAVENOUS
  Administered 2019-01-26: 40 ug/min via INTRAVENOUS
  Administered 2019-01-26: 70 ug/min via INTRAVENOUS
  Filled 2019-01-26: qty 250
  Filled 2019-01-26 (×2): qty 500

## 2019-01-26 MED ORDER — HALOPERIDOL LACTATE 5 MG/ML IJ SOLN: 0.5000 mg | Freq: Four times a day (QID) | INTRAMUSCULAR | Status: DC | PRN

## 2019-01-26 MED ORDER — CHLORHEXIDINE GLUCONATE 0.12% ORAL RINSE (MEDLINE KIT)
15.0000 mL | Freq: Two times a day (BID) | OROMUCOSAL | Status: DC
  Administered 2019-01-26: 15 mL via OROMUCOSAL

## 2019-01-26 MED ORDER — GLYCOPYRROLATE 0.2 MG/ML IJ SOLN
0.2000 mg | INTRAMUSCULAR | Status: DC | PRN
  Administered 2019-01-26: 0.2 mg via INTRAVENOUS

## 2019-01-26 MED ORDER — DOBUTAMINE IN D5W 4-5 MG/ML-% IV SOLN
2.5000 ug/kg/min | INTRAVENOUS | Status: DC
  Filled 2019-01-26 (×2): qty 250

## 2019-01-26 MED ORDER — FENTANYL CITRATE (PF) 100 MCG/2ML IJ SOLN
INTRAMUSCULAR | Status: AC
  Filled 2019-01-26: qty 2

## 2019-01-26 MED ORDER — AMIODARONE HCL IN DEXTROSE 360-4.14 MG/200ML-% IV SOLN
INTRAVENOUS | Status: AC
  Filled 2019-01-26: qty 200

## 2019-01-26 MED ORDER — SODIUM BICARBONATE 8.4 % IV SOLN
100.0000 meq | Freq: Once | INTRAVENOUS | Status: AC
  Administered 2019-01-26: 100 meq via INTRAVENOUS

## 2019-01-26 MED ORDER — FENTANYL CITRATE (PF) 100 MCG/2ML IJ SOLN
25.0000 ug | INTRAMUSCULAR | Status: DC | PRN
  Filled 2019-01-26: qty 2

## 2019-01-26 MED ORDER — MIDAZOLAM BOLUS VIA INFUSION
5.0000 mg | Freq: Once | INTRAVENOUS | Status: AC
  Administered 2019-01-26: 5 mg via INTRAVENOUS
  Filled 2019-01-26: qty 5

## 2019-01-26 MED ORDER — TICAGRELOR 90 MG PO TABS: 90.0000 mg | ORAL_TABLET | Freq: Two times a day (BID) | ORAL | Status: DC

## 2019-01-26 MED ORDER — TICAGRELOR 90 MG PO TABS
180.0000 mg | ORAL_TABLET | ORAL | Status: AC
  Administered 2019-01-26: 180 mg via ORAL
  Filled 2019-01-26: qty 2

## 2019-01-26 MED ORDER — MIDAZOLAM HCL 2 MG/2ML IJ SOLN
INTRAMUSCULAR | Status: AC
  Filled 2019-01-26: qty 2

## 2019-01-26 MED ORDER — DOPAMINE-DEXTROSE 3.2-5 MG/ML-% IV SOLN
0.0000 ug/kg/min | INTRAVENOUS | Status: DC
  Administered 2019-01-26 (×2): 12.5 ug/kg/min via INTRAVENOUS
  Filled 2019-01-26 (×2): qty 250

## 2019-01-26 MED ORDER — MIDAZOLAM 50MG/50ML (1MG/ML) PREMIX INFUSION
1.0000 mg/h | INTRAVENOUS | Status: DC
  Administered 2019-01-26: 2 mg/h via INTRAVENOUS
  Filled 2019-01-26: qty 50

## 2019-01-26 MED ORDER — MORPHINE 100MG IN NS 100ML (1MG/ML) PREMIX INFUSION
5.0000 mg/h | INTRAVENOUS | Status: DC
  Administered 2019-01-26: 5 mg/h via INTRAVENOUS
  Filled 2019-01-26: qty 100

## 2019-01-26 MED ORDER — IOHEXOL 300 MG/ML  SOLN
INTRAMUSCULAR | Status: DC | PRN
  Administered 2019-01-25: 150 mL

## 2019-01-26 MED ORDER — DEXMEDETOMIDINE HCL IN NACL 400 MCG/100ML IV SOLN
0.0000 ug/kg/h | INTRAVENOUS | Status: DC
  Administered 2019-01-26: 1 ug/kg/h via INTRAVENOUS
  Administered 2019-01-26 (×3): 1.2 ug/kg/h via INTRAVENOUS
  Filled 2019-01-26: qty 200
  Filled 2019-01-26 (×3): qty 100

## 2019-01-26 MED ORDER — EPINEPHRINE HCL 5 MG/250ML IV SOLN IN NS
0.5000 ug/min | INTRAVENOUS | Status: DC
  Administered 2019-01-26: 8 ug/min via INTRAVENOUS
  Administered 2019-01-26: 0.5 ug/min via INTRAVENOUS
  Filled 2019-01-26 (×2): qty 250

## 2019-01-26 MED ORDER — ORAL CARE MOUTH RINSE
15.0000 mL | OROMUCOSAL | Status: DC
  Administered 2019-01-26 (×5): 15 mL via OROMUCOSAL

## 2019-01-26 MED ORDER — HEPARIN (PORCINE) IN NACL 1000-0.9 UT/500ML-% IV SOLN
INTRAVENOUS | Status: DC | PRN
  Administered 2019-01-25 (×3): 500 mL

## 2019-01-26 MED ORDER — FENTANYL CITRATE (PF) 100 MCG/2ML IJ SOLN: 50.0000 ug | Freq: Once | INTRAMUSCULAR | Status: DC

## 2019-01-26 MED ORDER — SODIUM CHLORIDE 0.9 % IV SOLN
6.0000 g | Freq: Once | INTRAVENOUS | Status: AC
  Administered 2019-01-26: 6 g via INTRAVENOUS
  Filled 2019-01-26: qty 2

## 2019-01-26 MED ORDER — MIDAZOLAM 50MG/50ML (1MG/ML) PREMIX INFUSION
5.0000 mg/h | INTRAVENOUS | Status: DC
  Administered 2019-01-26: 5 mg/h via INTRAVENOUS
  Filled 2019-01-26: qty 50

## 2019-01-26 MED ORDER — INSULIN ASPART 100 UNIT/ML ~~LOC~~ SOLN
0.0000 [IU] | SUBCUTANEOUS | Status: DC
  Administered 2019-01-26: 20 [IU] via SUBCUTANEOUS

## 2019-01-26 MED ORDER — SODIUM CHLORIDE 0.9 % IV SOLN: INTRAVENOUS | Status: DC

## 2019-01-29 ENCOUNTER — Telehealth (HOSPITAL_COMMUNITY): Payer: Self-pay | Admitting: *Deleted

## 2019-01-29 LAB — BLOOD GAS, ARTERIAL
Acid-base deficit: 18.2 mmol/L — ABNORMAL HIGH (ref 0.0–2.0)
Acid-base deficit: 13.8 mmol/L — ABNORMAL HIGH (ref 0.0–2.0)
Bicarbonate: 10.2 mmol/L — ABNORMAL LOW (ref 20.0–28.0)
Bicarbonate: 13.4 mmol/L — ABNORMAL LOW (ref 20.0–28.0)
FIO2: 1
FIO2: 0.6
MECHVT: 450 mL
MECHVT: 450 mL
Mechanical Rate: 18
O2 Saturation: 100 %
O2 Saturation: 98.4 %
PEEP: 5 cmH2O
PEEP: 5 cmH2O
Patient temperature: 37
Patient temperature: 37
RATE: 18 resp/min
RATE: 22 resp/min
pCO2 arterial: 32 mmHg (ref 32.0–48.0)
pCO2 arterial: 35 mmHg (ref 32.0–48.0)
pH, Arterial: 7.11 — CL (ref 7.350–7.450)
pO2, Arterial: 242 mmHg — ABNORMAL HIGH (ref 83.0–108.0)

## 2019-01-29 NOTE — Telephone Encounter (Signed)
Estill Bamberg w/[employer] left VM asking if Dr.Bensimhon will sign pts death certificate. I called Estill Bamberg back at [phone] and was put on hold for 78minutes. I had to see a patient at the front desk so I ended the call. Will try Estill Bamberg back this afternoon if she does not return call.

## 2019-01-30 NOTE — H&P (Signed)
Cardiology Consult    Patient ID: DULCINEA KINSER MRN: [id number], DOB/AGE: 1945/03/29   Admit date: 02/09/19 Date of Consult: 02-09-2019  Primary Physician: [facility] Primary Cardiologist: No primary care provider on file. Requesting Provider: CCM  Patient Profile    BERDELLA BACOT is a 73 y.o. female with history of a-fib, hypertension, hyperlipidemia, and diabetes, who presented to [facility] ED with anterior STEMI and cardiogenic shock, run of VT and started on amiodarone, brought to cath lab emergency and received PCI to LAD and placement of intra-aortic balloon pump before transfer to CCU.   History of Present Illness    Patient initially was confused and uncooperative in the ED, had to be intubated to allow needed care, had run of VT in ED and started on amiodarone before going for emergent LHC. She was found to have 2v disease with acute ostial/prox LAD stenosis, successful PCI with DES was performed. Given hypotension and concern for cardiogenic shock, intra-aortic balloon pump was placed and patient started on dobutamine and norepinephrine. She was then transferred to [facility] for additional management.    On arrival, patient intubated on ventilator on multiple pressors as well as aggrastat and heparin infusion, had reportedly not yet been loaded with oral P2Y12 inhibitor. She is cool on exam but not much lower extremity edema, has wide pulse pressure with systolic 655'V and diastolic 74'M. CCM team performed R-IJ central line placement, initial mixed venous saturation was normal but exam and overall picture still consistent with cardiogenic shock. Initial lactate > 10, blood gas 7.21 / 26 / 78, bicarb 11. She is saturating well on 50% oxygen PEEP 5, good tidal volumes. UOP > 50cc/hr since arrival.   Past Medical History   Past Medical History:  Diagnosis Date  . Arthritis   . Diabetes mellitus without complication ([place])   . Hyperlipidemia   . Hypertension   .  Ovarian cancer [employer])     Past Surgical History:  Procedure Laterality Date  . ABDOMINAL HYSTERECTOMY    . APPENDECTOMY    . FRACTURE SURGERY Left    arm  . KNEE SURGERY Left   . LAPAROSCOPIC OOPHERECTOMY       Allergies  Allergen Reactions  . Contrast Media [Iodinated Diagnostic Agents]   . Dipyridamole Nausea Only and Nausea And Vomiting  . Tramadol Nausea Only   Inpatient Medications    . chlorhexidine gluconate (MEDLINE KIT)  15 mL Mouth Rinse BID  . fentaNYL      . fentaNYL (SUBLIMAZE) injection  50 mcg Intravenous Once  . insulin aspart  0-20 Units Subcutaneous Q4H  . mouth rinse  15 mL Mouth Rinse 10 times per day  . midazolam      . midazolam  1 mg Intravenous Once  . ticagrelor  180 mg Oral STAT  . ticagrelor  90 mg Oral BID    Family History    Family History  Problem Relation Age of Onset  . Uterine cancer Mother   . Breast cancer Mother   . Heart attack Mother   . CAD Mother 30       bypass x 3  . Alzheimer's disease Father   . Macular degeneration Father    She indicated that her mother is deceased. She indicated that her father is deceased.   Social History    Social History   Socioeconomic History  . Marital status: Married    Spouse name: Not on file  . Number of children: Not  on file  . Years of education: Not on file  . Highest education level: Not on file  Occupational History  . Not on file  Social Needs  . Financial resource strain: Not on file  . Food insecurity    Worry: Not on file    Inability: Not on file  . Transportation needs    Medical: Not on file    Non-medical: Not on file  Tobacco Use  . Smoking status: Never Smoker  . Smokeless tobacco: Never Used  Substance and Sexual Activity  . Alcohol use: Never    Frequency: Never  . Drug use: Never  . Sexual activity: Not on file  Lifestyle  . Physical activity    Days per week: Not on file    Minutes per session: Not on file  . Stress: Not on file  Relationships   . Social Herbalist on phone: Not on file    Gets together: Not on file    Attends religious service: Not on file    Active member of club or organization: Not on file    Attends meetings of clubs or organizations: Not on file    Relationship status: Not on file  . Intimate partner violence    Fear of current or ex partner: Not on file    Emotionally abused: Not on file    Physically abused: Not on file    Forced sexual activity: Not on file  Other Topics Concern  . Not on file  Social History Narrative  . Not on file     Review of Systems    ROS unable to be performed, pt critically ill, intubated, sedated  Physical Exam    Blood pressure (!) 107/35, pulse (!) 108, temperature (!) 92.2 F (33.4 C), temperature source Rectal, resp. rate (!) 22, SpO2 98 %.  Vent Mode: PRVC FiO2 (%):  [40 %-100 %] 60 % Set Rate:  [18 bmp-22 bmp] 22 bmp Vt Set:  [450 mL-470 mL] 470 mL PEEP:  [5 cmH20] 5 cmH20  Intake/Output Summary (Last 24 hours) at 28-Jan-2019 0412 Last data filed at 2019-01-28 0300 Gross per 24 hour  Intake 643.65 ml  Output 150 ml  Net 493.65 ml   Wt Readings from Last 3 Encounters:  01/25/19 127 kg  04/25/18 111.1 kg  01/13/18 111.1 kg   CONSTITUTIONAL: critically ill, intubated, sedated HEENT: ET tube in place with dried blood around oropharynx CARDIOVASCULAR: distant heart sounds, Regular rhythm. No gallop, murmur, or rub. Normal S1/S2. Radial pulses intact. JVP difficult to appreciate while intubated on ventillator.  PULMONARY/CHEST WALL: no deformities, normal breath sounds bilaterally, normal work of breathing ABDOMINAL: soft, non-tender, non-distended EXTREMITIES: cold lower extremities with faint pulse, scant edema bilaterally SKIN: scattered bruising around IV sites NEUROLOGIC: intubated, sedated  Labs    Troponin (Point of Care Test) No results for input(s): TROPIPOC in the last 72 hours. No results for input(s): CKTOTAL, CKMB, TROPONINI  in the last 72 hours. Lab Results  Component Value Date   WBC 17.6 (H) 01-28-19   HGB 13.6 2019-01-28   HCT 40.0 01-28-2019   MCV 107.1 (H) Jan 28, 2019   PLT 156 01-28-2019    Recent Labs  Lab 01/25/19 1811 2019-01-28 0330  NA 141 144  K 3.5 3.2*  CL 112*  --   CO2 16*  --   BUN 20  --   CREATININE 0.91  --   CALCIUM 8.4*  --   PROT 5.6*  --  BILITOT 1.4*  --   ALKPHOS 84  --   ALT 17  --   AST 31  --   GLUCOSE 294*  --    Lab Results  Component Value Date   CHOL 184 01/25/2019   HDL 40 (L) 01/25/2019   LDLCALC 118 (H) 01/25/2019   TRIG 128 01/25/2019   No results found for: [facility]   Radiology Studies    Dg Chest Port 1 View  Result Date: 2019/02/06 CLINICAL DATA:  Status post central line placement EXAM: PORTABLE CHEST 1 VIEW COMPARISON:  01/13/2018 FINDINGS: Cardiac shadow is stable. Endotracheal tube is noted in satisfactory position. Gastric catheter courses towards the stomach although the tip is not well visualized on this exam. Right jugular central line is noted in the proximal superior vena cava. No pneumothorax is noted. Small bilateral pleural effusions are noted left slightly greater than right. Intra-aortic balloon pump is noted in the descending thoracic aorta. No acute bony abnormality is noted. IMPRESSION: Tubes and lines as described above.  No pneumothorax is noted. Bilateral pleural effusions are noted. Electronically Signed   By: Inez Catalina M.D.   On: 02-06-19 03:16    ECG & Cardiac Imaging    - LHC 01/25/2019 1.  Acute anterior ST elevation myocardial infarction with cardiogenic shock 2. Two-vessel coronary artery disease, with acute occlusion of ostial/proximal LAD, 70% stenosis OM1, 75% stenosis mid left circumflex 3.  Severe cardiomyopathy with anterior apical akinesis 4.  Successful primary PCI with DES ostial/proximal LAD 5.  Intra-aortic balloon pump placement  - ECG with atrial fibrillation and anterior STEMI V2-V4, reciprocal  inferior STD are present  - I performed bedside TTE in CCU, very difficult windows 2/2 intubation, positioning, habitus. Overall showed LVH with severe global LV hypokinesis with estimated LVEF < 20%, RV normal in size with mild dysfunction, there was mild/moderate MR but no other obvious significant valvular disease. No significant pericardial effusion was present.   Assessment & Plan    12F w/ pAF, HTN, HLD, DM, who presented to [facility] with anterior STEMI and cardiogenic shock now s/p PCI to LAD. Patient was then transferred to [facility] arriving early this AM. Unfortunately patient remains critically ill with cardiogenic shock requiring multiple pressors, 1:1 IABP in place, initial lactate 10, bedside TTE with severe global LV dysfunction, relatively preserved RV function. Overall she unfortunately has poor prognosis given continued poor cardiac function post PCI, limited options available.   Anterior STEMI s/p PCI, cardiogenic shock - Mixed venous sat from distal port of R-IJ triple lumen 74% at 3am but initial lactate 10 and overall picture consistent with cardiogenic shock - troponin 49 -> > 27000 s/p PCI to LAD, continue to trend to peak for prognostication - daily aspirin 59m + high intensity statin  - ticagrelor 1865mload complete, cont 9063mO BID - off heparin and aggrastat post PCI and ticagrelor load - on dobutamine 7.5mg75mIABP 1:1 currently, multiple other pressors, remains hypotensive with elevated lactate, may consider additional mechanical support but prognosis guarded, unclear where bridge would lead to and ultimately seems unlikely to change trajectory  Atrial fibrillation w/ RVR - limited history available, but appears to have known a-fib as prior diagnosis and eliquis on outpatient medication list - rates currently low 100's on amiodarone infusion, would continue for now - holding beta-blockers or ca-blockers in setting of shock - would anticoagulate when possible for  chronic stroke prevention  Acute hypoxemic respiratory failure - ventilated, sedated, CCM managingupport -  VAP bundle, serial ECG's  Signed, Thomasena Edis, MD 2019-01-30, 4:12 AM  For questions or updates, please contact   Please consult [email or web address] for contact info under Cardiology/STEMI.

## 2019-01-30 NOTE — Progress Notes (Signed)
[place] Progress Note Patient Name: Monica Mullins DOB: 1945/03/12 MRN: [id number]   Date of Service  Jan 30, 2019  HPI/Events of Note  Frequent ectopy, K+ 3.2, Mg ++ 1.5. Pt is also on Dopamine at 12.5 mcg and Epinephrine infusion is about to be started per cardiology orders. Pt is on IABP.  eICU Interventions  K+ and Mg++ replaced per electrolyte replacement protocol, bedside RN instructed to try to slowly wean Dopamine infusion to lower rate once Epinephrine infusion is started. Versed infusion started at 0.5 mg / hour for amnesia without hypotension while on IABP.        Kerry Kass Ilze Roselli 30-Jan-2019, 6:32 AM

## 2019-01-30 NOTE — Progress Notes (Signed)
[place] Progress Note Patient Name: RAIN FLIEGEL DOB: 1945/06/22 MRN: [id number]   Date of Service  02-10-2019  HPI/Events of Note    eICU Interventions  Abdominal ultrasound canceled and pelvic ultrasound with transvaginal substituted.        Kerry Kass Palak Tercero 02/10/19, 3:42 AM

## 2019-01-30 NOTE — Progress Notes (Signed)
PCCM:  Case discussed with cardiology.  Patient's family coming for palliative care withdrawal.  We agree with comfort care transition.  Critical care will be available as needed.  Garner Nash, DO [facility] 02-12-2019 9:32 AM

## 2019-01-30 NOTE — Procedures (Signed)
Central Venous Catheter Insertion Procedure Note SHANELE BELONGIA [id number] 1946/02/09  Procedure: Insertion of Central Venous Catheter Indications: Assessment of intravascular volume, Drug and/or fluid administration and Frequent blood sampling  Procedure Details Consent: Risks of procedure as well as the alternatives and risks of each were explained to the (patient/caregiver).  Consent for procedure obtained. Time Out: Verified patient identification, verified procedure, site/side was marked, verified correct patient position, special equipment/implants available, medications/allergies/relevent history reviewed, required imaging and test results available.  Performed  Maximum sterile technique was used including antiseptics, cap, gloves, gown, hand hygiene, mask and sheet. Skin prep: Chlorhexidine; local anesthetic administered A antimicrobial bonded/coated triple lumen catheter was placed in the right internal jugular vein using the Seldinger technique. Catheter placed to 16 cm. Blood aspirated via all 3 ports and then flushed x 3. Line sutured x 4 and dressing applied.  Ultrasound guidance used.Yes.    Evaluation Blood flow good Complications: No apparent complications Patient did tolerate procedure well. Chest X-ray ordered to verify placement.  CXR: normal.   Georgann Housekeeper, AGACNP-BC [place]  See Amion for personal pager PCCM on call pager [phone]  February 10, 2019 3:09 AM

## 2019-01-30 NOTE — Progress Notes (Signed)
Advanced Heart Failure Rounding Note   Subjective:    She remains intubated on IABP. Unresponsive.   On epi, NE, dopa and dobutamine. Co-ox 74% but persistent lactate > 11 more than 12 hours out.   MAPs on IABP in 60s.    Objective:   Weight Range:  Vital Signs:   Temp:  [90.7 F (32.6 C)-98.3 F (36.8 C)] 93.2 F (34 C) (11/27 0800) Pulse Rate:  [54-178] 121 (11/27 0800) Resp:  [0-37] 22 (11/27 0823) BP: (42-116)/(24-96) 95/52 (11/27 0823) SpO2:  [95 %-100 %] 99 % (11/27 0800) Arterial Line BP: (77-140)/(32-55) 116/46 (11/27 0800) FiO2 (%):  [40 %-100 %] 50 % (11/27 0823) Weight:  [929 kg] 127 kg (11/26 1814)    Weight change:  Intake/Output:   Intake/Output Summary (Last 24 hours) at 02/01/19 0842 Last data filed at Feb 01, 2019 0600 Gross per 24 hour  Intake 1621.61 ml  Output 550 ml  Net 1071.61 ml     Physical Exam: General: ill appearing. On vent. Not responsive HEENT: normal Neck: supple. RIJ TLC . Carotids 2+ bilat; no bruits. No lymphadenopathy or thryomegaly appreciated. Cor: PMI nondisplaced. Irr distant heart sounds  Fungal rash under right breast  Lungs: coarse Abdomen: obese soft, nontender, nondistended. No hepatosplenomegaly. No bruits or masses. Hypoactive bowel sounds. Extremities: cool no cyanosis, clubbing, rash, 1+ edema  RFA IABP ok  Neuro: sedated on vent non responsive  Telemetry: AF 100-120 Personally reviewed   Labs: Basic Metabolic Panel: Recent Labs  Lab 01/25/19 1811 02-01-2019 0155 01-Feb-2019 0330 February 01, 2019 0704  NA 141 143 144 144  K 3.5 3.3* 3.2* 3.2*  CL 112* 112*  --   --   CO2 16* 14*  --   --   GLUCOSE 294* 410*  --   --   BUN 20 19  --   --   CREATININE 0.91 1.31*  --   --   CALCIUM 8.4* 7.6*  --   --   MG  --  1.5*  --   --     Liver Function Tests: Recent Labs  Lab 01/25/19 1811 02/01/2019 0155  AST 31 1,264*  ALT 17 265*  ALKPHOS 84 92  BILITOT 1.4* 1.8*  PROT 5.6* 4.4*  ALBUMIN 2.8* 2.2*   No  results for input(s): LIPASE, AMYLASE in the last 168 hours. No results for input(s): AMMONIA in the last 168 hours.  CBC: Recent Labs  Lab 01/25/19 1811 Feb 01, 2019 0155 02-01-19 0330 02-01-19 0704  WBC 9.8 17.6*  --   --   NEUTROABS 7.4 15.9*  --   --   HGB 14.9 13.4 13.6 12.6  HCT 44.0 42.2 40.0 37.0  MCV 97.3 107.1*  --   --   PLT 145* 156  --   --     Cardiac Enzymes: No results for input(s): CKTOTAL, CKMB, CKMBINDEX, TROPONINI in the last 168 hours.  BNP: BNP (last 3 results) Recent Labs    02/01/2019 0155  BNP 262.9*    ProBNP (last 3 results) No results for input(s): PROBNP in the last 8760 hours.    Other results:  Imaging: Dg Chest Port 1 View  Result Date: 02-01-19 CLINICAL DATA:  Status post central line placement EXAM: PORTABLE CHEST 1 VIEW COMPARISON:  01/13/2018 FINDINGS: Cardiac shadow is stable. Endotracheal tube is noted in satisfactory position. Gastric catheter courses towards the stomach although the tip is not well visualized on this exam. Right jugular central line is noted in the proximal  superior vena cava. No pneumothorax is noted. Small bilateral pleural effusions are noted left slightly greater than right. Intra-aortic balloon pump is noted in the descending thoracic aorta. No acute bony abnormality is noted. IMPRESSION: Tubes and lines as described above.  No pneumothorax is noted. Bilateral pleural effusions are noted. Electronically Signed   By: Inez Catalina M.D.   On: 02-10-2019 03:16   US Pelvic Complete With Transvaginal  Result Date: Feb 10, 2019 CLINICAL DATA:  Vaginal bleeding EXAM: TRANSABDOMINAL AND TRANSVAGINAL ULTRASOUND OF PELVIS TECHNIQUE: Both transabdominal and transvaginal ultrasound examinations of the pelvis were performed. Transabdominal technique was performed for global imaging of the pelvis including uterus, ovaries, adnexal regions, and pelvic cul-de-sac. It was necessary to proceed with endovaginal exam following the  transabdominal exam to visualize the adnexa. COMPARISON:  None FINDINGS: Uterus Surgically absent Endometrium Surgically absent Right ovary Not visualized. Left ovary Not visualized. Other findings Patient is post hysterectomy. Imaging quality is significantly degraded by body habitus and inability to position patient due to ICU ventilator status and a right groin arterial line limiting the use of stirrups. IMPRESSION: Significant study limitations due to inability to reposition patient and the patient's ventilated ICU status. Post hysterectomy.  The ovaries are not visualized. Electronically Signed   By: Lovena Le M.D.   On: 02-10-19 04:53      Medications:     Scheduled Medications: . aspirin  81 mg Oral Daily  . atorvastatin  40 mg Oral q1800  . chlorhexidine gluconate (MEDLINE KIT)  15 mL Mouth Rinse BID  . fentaNYL (SUBLIMAZE) injection  50 mcg Intravenous Once  . insulin aspart  0-20 Units Subcutaneous Q4H  . mouth rinse  15 mL Mouth Rinse 10 times per day  . ticagrelor  90 mg Oral BID     Infusions: . sodium chloride 250 mL (02/10/2019 0649)  . amiodarone 30 mg/hr (02/10/2019 0839)  . dexmedetomidine (PRECEDEX) IV infusion 1.2 mcg/kg/hr (February 10, 2019 0758)  . DOBUTamine 7.5 mcg/kg/min (February 10, 2019 0120)  . DOPamine 12.5 mcg/kg/min (02-10-19 0757)  . epinephrine 0.5 mcg/min (02/10/19 0627)  . magnesium sulfate LVP 250-500 ml 6 g (2019/02/10 0658)  . midazolam 2 mg/hr (Feb 10, 2019 0752)  . norepinephrine (LEVOPHED) Adult infusion 65 mcg/min (02-10-19 0755)  . potassium chloride 10 mEq (February 10, 2019 0806)     PRN Medications:  sodium chloride, fentaNYL (SUBLIMAZE) injection, fentaNYL (SUBLIMAZE) injection   Assessment/Plan:   1. Cardiogenic shock, refractory - EF 15-20%  2. CAD with acute anterior STEMI 3. Severe lactic acidosis 4. Acute hypoxic respiratory failure 5. AKI 6. Shock liver 7. Hypokalemia 8. Obesity 9. DM2 10. Chronic AF 11. Early dementia  She is failing  maximal medical therapy and IABP support. Lactate still > 11 at 12 hours post PCI. Now with MSOF. ECG with persistent anterior Q waves   This is an unsurvivable situation and given poor baseline functional status would not be a candidate for advanced therapies.   I have spoken to family and discussed this with them. I have also d/w CCM team. Family will come to bedside this am an we will transition to comfort care. No further escalation of care indicated.   CRITICAL CARE Performed by: Glori Bickers  Total critical care time: 45 minutes  Critical care time was exclusive of separately billable procedures and treating other patients.  Critical care was necessary to treat or prevent imminent or life-threatening deterioration.  Critical care was time spent personally by me (independent of midlevel providers or residents) on the following activities:  development of treatment plan with patient and/or surrogate as well as nursing, discussions with consultants, evaluation of patient's response to treatment, examination of patient, obtaining history from patient or surrogate, ordering and performing treatments and interventions, ordering and review of laboratory studies, ordering and review of radiographic studies, pulse oximetry and re-evaluation of patient's condition.    Length of Stay: 0   Glori Bickers MD 19-Feb-2019, 8:42 AM  Advanced Heart Failure Team Pager [phone] (M-F; 7a - 4p)  Please contact [facility] for night-coverage after hours (4p -7a ) and weekends on [email or web address]

## 2019-01-30 NOTE — Progress Notes (Signed)
RT obtained ABG on pt per order with the following changes. CCM NP Hoffman notified of critical values on ABG. No vent changes needed at this time. RT will continue to monitor.   Results for Monica Mullins, Monica Mullins (MRN [id number]) as of 02/01/2019 03:46  Ref. Range 02/01/2019 03:30  pH, Arterial Latest Ref Range: 7.350 - 7.450  7.213 (L)  pCO2 arterial Latest Ref Range: 32.0 - 48.0 mmHg 26.7 (L)  pO2, Arterial Latest Ref Range: 83.0 - 108.0 mmHg 78.0 (L)  TCO2 Latest Ref Range: 22 - 32 mmol/L 12 (L)  Acid-base deficit Latest Ref Range: 0.0 - 2.0 mmol/L 16.0 (H)  Bicarbonate Latest Ref Range: 20.0 - 28.0 mmol/L 11.3 (L)  O2 Saturation Latest Units: % 95.0  Patient temperature Unknown 92.2 F  Collection site Unknown RADIAL, ALLEN'S TEST ACCEPTABLE

## 2019-01-30 NOTE — Progress Notes (Signed)
  Echocardiogram 2D Echocardiogram has been performed.  Monica Mullins 01/30/2019, 10:02 AM

## 2019-01-30 NOTE — Progress Notes (Signed)
Chaplain offered spiritual support and presence to family inside Monica Mullins's room and to her daughter in the waiting area.  Family was tearful as bedside.    Chaplain will continue to follow-up as needed.

## 2019-01-30 NOTE — Telephone Encounter (Signed)
Received call from [place] from [employer]. She inquired where to take death certificate to be signed by Dr Haroldine Laws. They will indicate how they would like it to be returned to them.

## 2019-01-30 NOTE — Progress Notes (Signed)
PCCM INTERVAL PROGRESS NOTE    Called to bedside for blood pressure concerns. Unable to wean peripheral levo off after starting levo through central line. Will give 2 amps bicarb. If unable to wean peripheral levo at that time, will add epinephrine infusion.    Georgann Housekeeper, AGACNP-BC [place]  See Amion for personal pager PCCM on call pager [phone]  01-Feb-2019 4:38 AM

## 2019-01-30 NOTE — Progress Notes (Signed)
RT obtained ABG with the following results. No vent changes at this time. MD Bodziock aware of results. RT will continue to monitor.   Results for Monica Mullins, Monica Mullins (MRN [id number]) as of 2019/02/05 07:09  Ref. Range February 05, 2019 07:04  Sample type Unknown ARTERIAL  pH, Arterial Latest Ref Range: 7.350 - 7.450  7.247 (L)  pCO2 arterial Latest Ref Range: 32.0 - 48.0 mmHg 23.6 (L)  pO2, Arterial Latest Ref Range: 83.0 - 108.0 mmHg 88.0  TCO2 Latest Ref Range: 22 - 32 mmol/L 12 (L)  Acid-base deficit Latest Ref Range: 0.0 - 2.0 mmol/L 16.0 (H)  Bicarbonate Latest Ref Range: 20.0 - 28.0 mmol/L 10.7 (L)  O2 Saturation Latest Units: % 97.0  Patient temperature Unknown 33.5 C  Collection site Unknown RADIAL, ALLEN'S TEST ACCEPTABLE

## 2019-01-30 NOTE — Progress Notes (Signed)
I spoke with patient's daughter and husband at bedside and updated on her condition in detail.   They bot said that Ms. Melling was clear in the past that she would never want to be kept alive by machines for any prolonged periods. They would like to wait for one more family member and proceed with comfort care and terminal extubation.   Code status updated.  Additional CCT 35 mins.    Glori Bickers, MD  2:41 PM

## 2019-01-30 NOTE — Progress Notes (Signed)
Patient extubated per MD order. Family and RN at bedside.

## 2019-01-30 NOTE — Progress Notes (Signed)
[facility] Progress Note Patient Name: Monica Mullins DOB: [birth year] MRN: [id number]   Date of Service  2019/02/08  HPI/Events of Note  73 yo F admitted through ED with Afib RVR and anterior STEMI, she was taken to the cath lab for emergent PCI and DES was placed in LAD, she is currently in cardiogenic shock with an IABP in place. She is Heparinized, has some bleeding that appears to be from her vagina per bedside RN. Pt has acute respiratory failure and is on the ventilator.  eICU Interventions  New patient evaluation completed, stat H & H ordered to check for acute blood loss anemia.        Kerry Kass Eoin Willden 02/08/2019, 1:56 AM

## 2019-01-30 DEATH — deceased

## 2019-02-02 ENCOUNTER — Encounter (HOSPITAL_COMMUNITY): Payer: Self-pay | Admitting: *Deleted

## 2019-02-02 NOTE — Progress Notes (Signed)
Death certificate completed and signed by Dr Haroldine Laws, copy faxed to [employer], they are aware and will p/u original

## 2019-03-02 NOTE — Discharge Summary (Addendum)
  Advanced Heart Failure Death Summary  Death Summary   Patient ID: Monica Mullins MRN: [id number], DOB/AGE: 1945/12/24 74 y.o. Admit date: February 02, 2019 D/C date:     February 02, 2019    Primary Discharge Diagnoses:  1. Cardiogenic shock, refractory - EF 15-20%  2. CAD with acute anterior STEMI HS Troponin 49>27000 PCI to LAD  3. Severe lactic acidosis Lactic Acid >11 4. Acute hypoxic respiratory failure 5. AKI 6. Shock liver 7. Hypokalemia 8. Obesity 9. DM2 10. A fib RVR 11. Early dementia 12. NSVT  Hospital Course:  Monica Mullins was a 74 y.o. female with history of a-fib, hypertension, hyperlipidemia, and diabetes, who presented to [facility] ED in acute distress with anterior STEMI,  cardiogenic shock, and acute respiratory distress requiring urgent intubation. Had  run of VT and started on amiodarone, brought to cath lab emergency and received PCI to LAD and placement of intra-aortic balloon pump before transfer to CCU. She was transferred emergently to [facility] and was seen byt the shock team.   Placed on multiple pressors with ongoing hypotension.She failed maximal medical therapy and IABP support. Lactate remained l > 11 at 12 hours post PCI. Now with MSOF. ECG with persistent anterior Q waves . Shock team felt that this was an unsurvivable situation and given poor baseline functional status would not be a candidate for advanced therapies. D/w family who said patient was clear in past that she would not want to be kept alive with mechanical support. Family requested full comfort care.   Patient placed on comfort drips and terminally extubated. She died within minutes.   Duration of Discharge Encounter: Greater than 35 minutes   Signed, Darrick Grinder, NP 01/30/2019, 1:16 PM   Agree with above.   Glori Bickers, MD  3:57 PM

## 2019-09-14 IMAGING — CR DG HIP (WITH OR WITHOUT PELVIS) 2-3V*R*
3 series · 3 of 3 positions shown · non-contrast
Comparison: None.

CLINICAL DATA: Acute right hip pain following fall 1 week ago.
Initial encounter.

EXAM:
DG HIP (WITH OR WITHOUT PELVIS) 2-3V RIGHT

[pelvis ap]
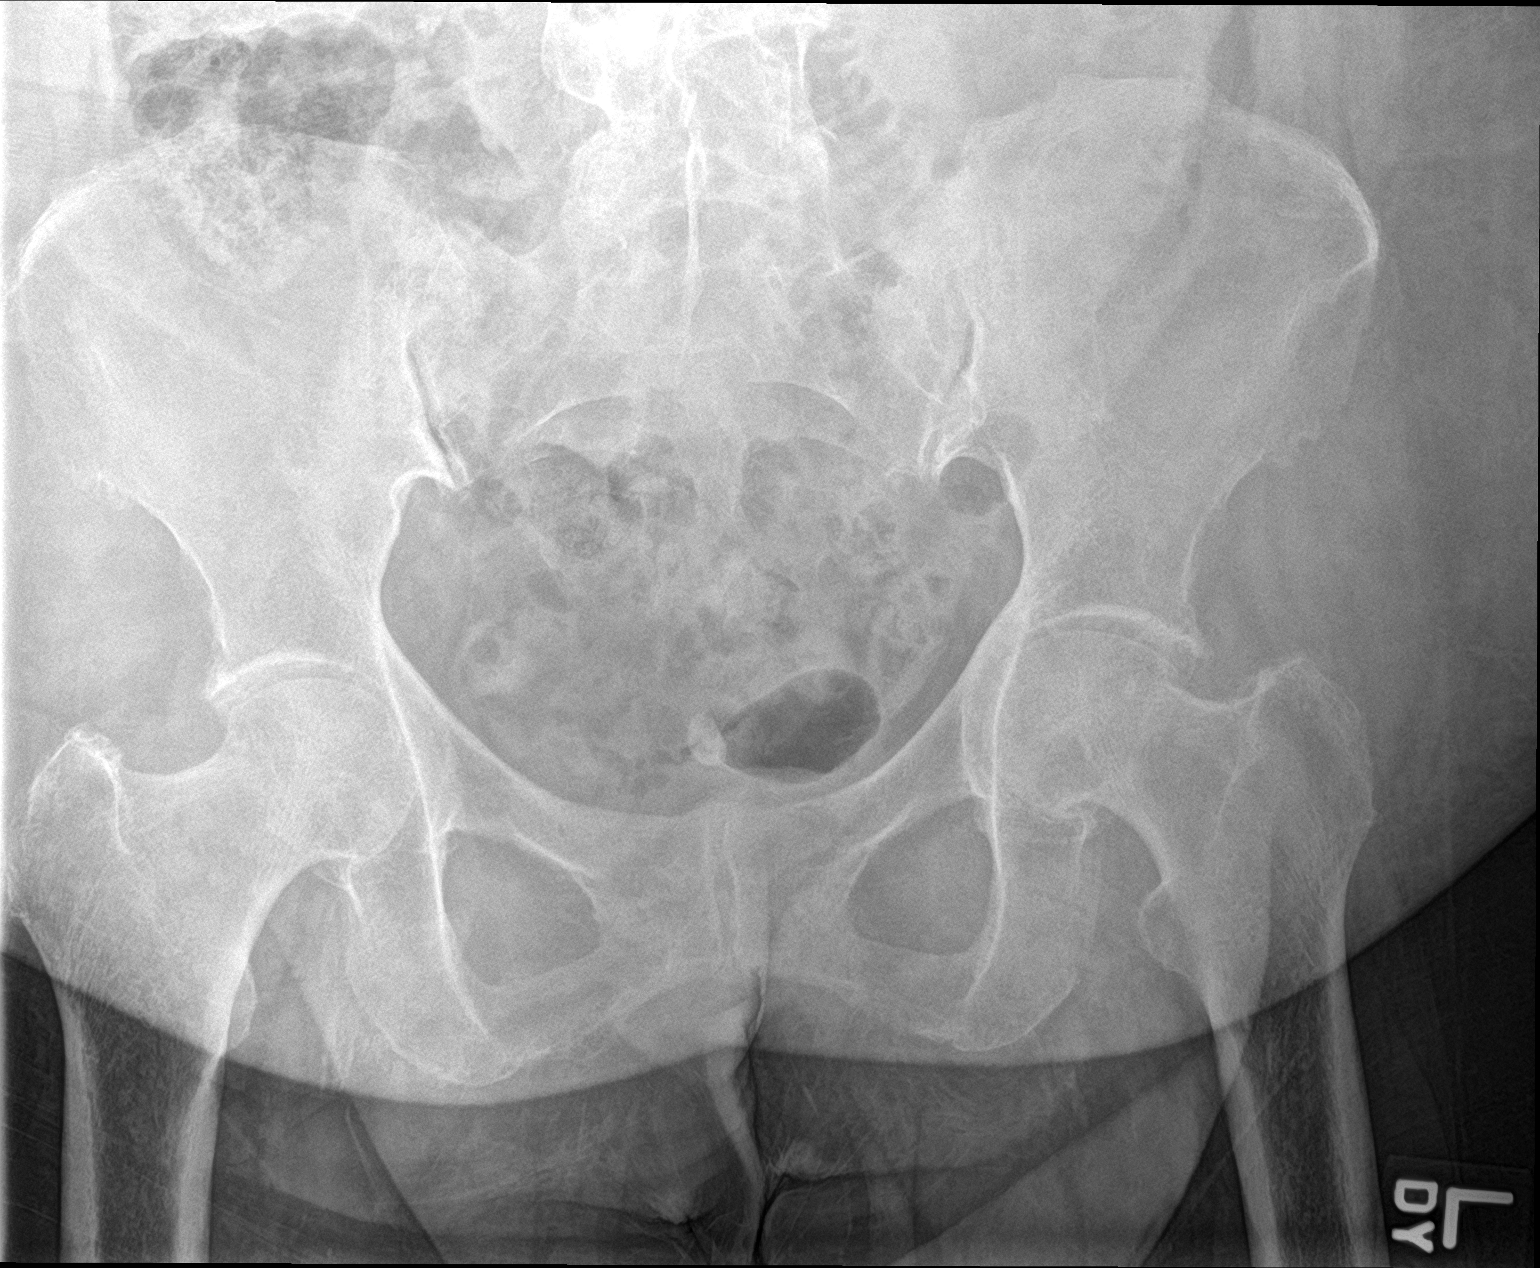

[hip ap]
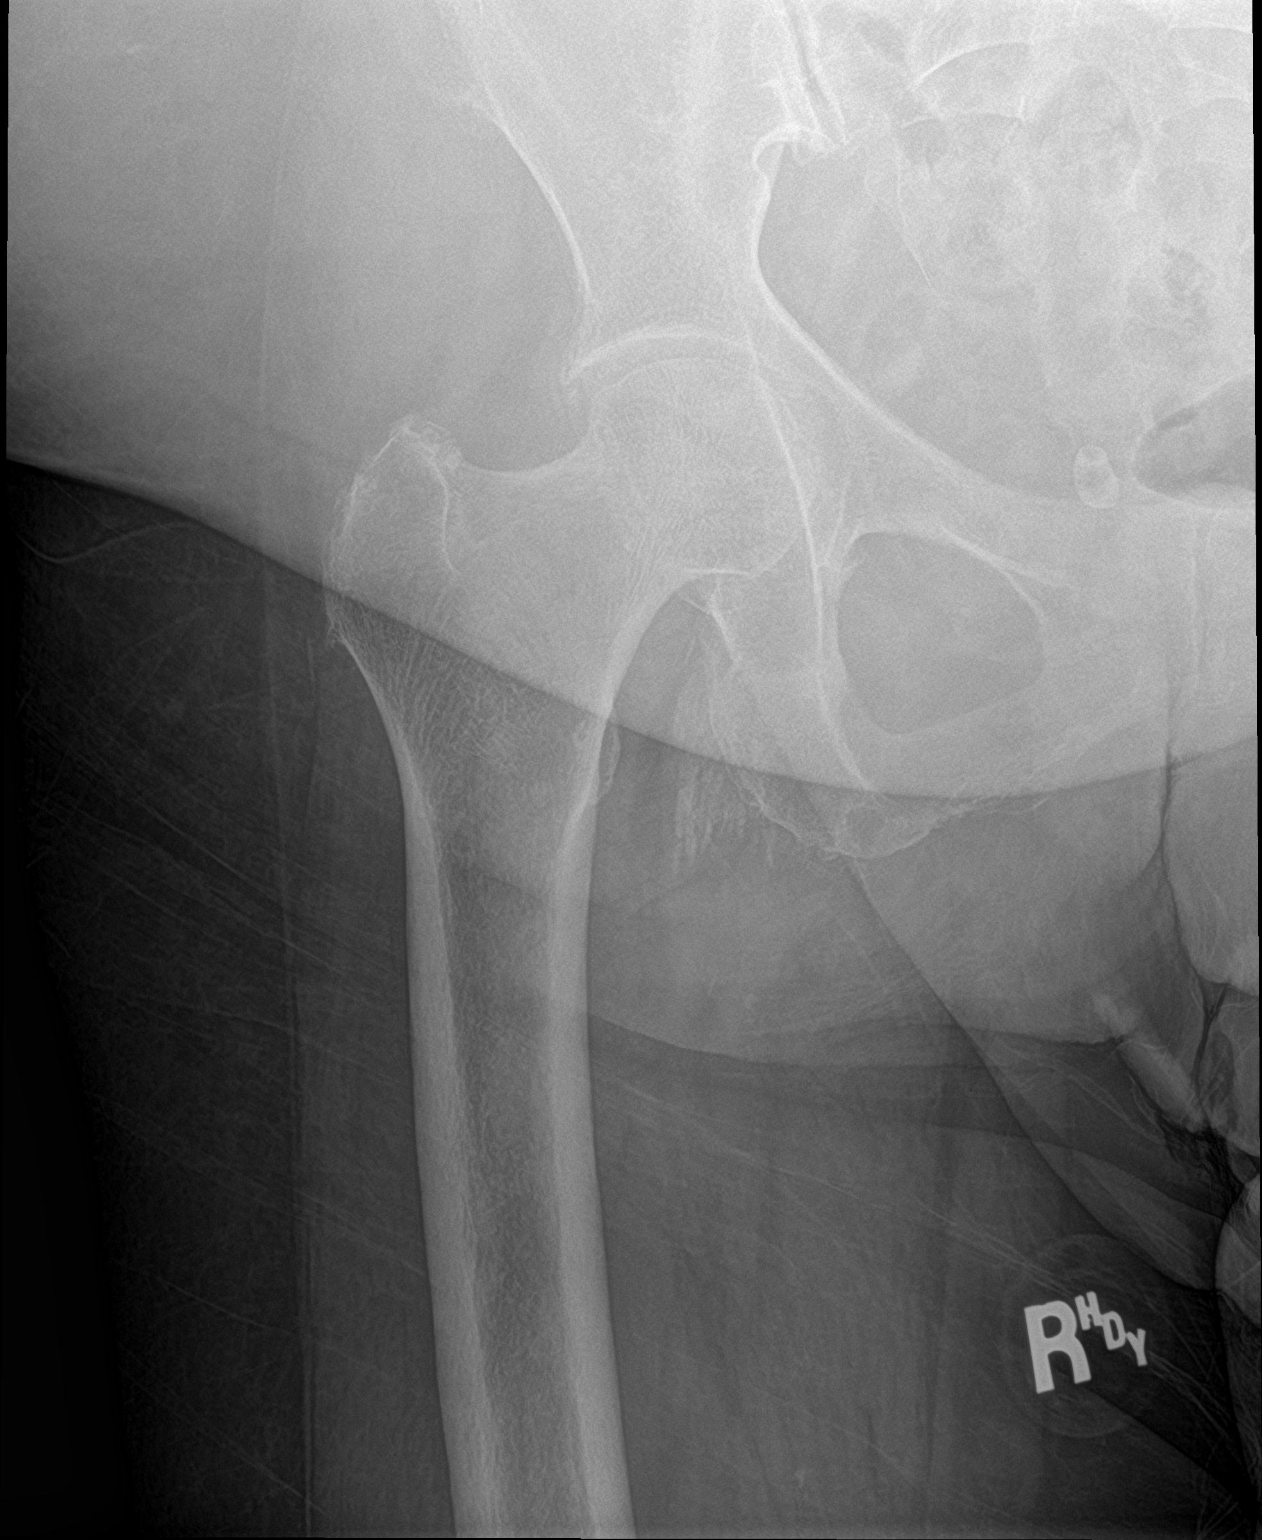

[hip lat]
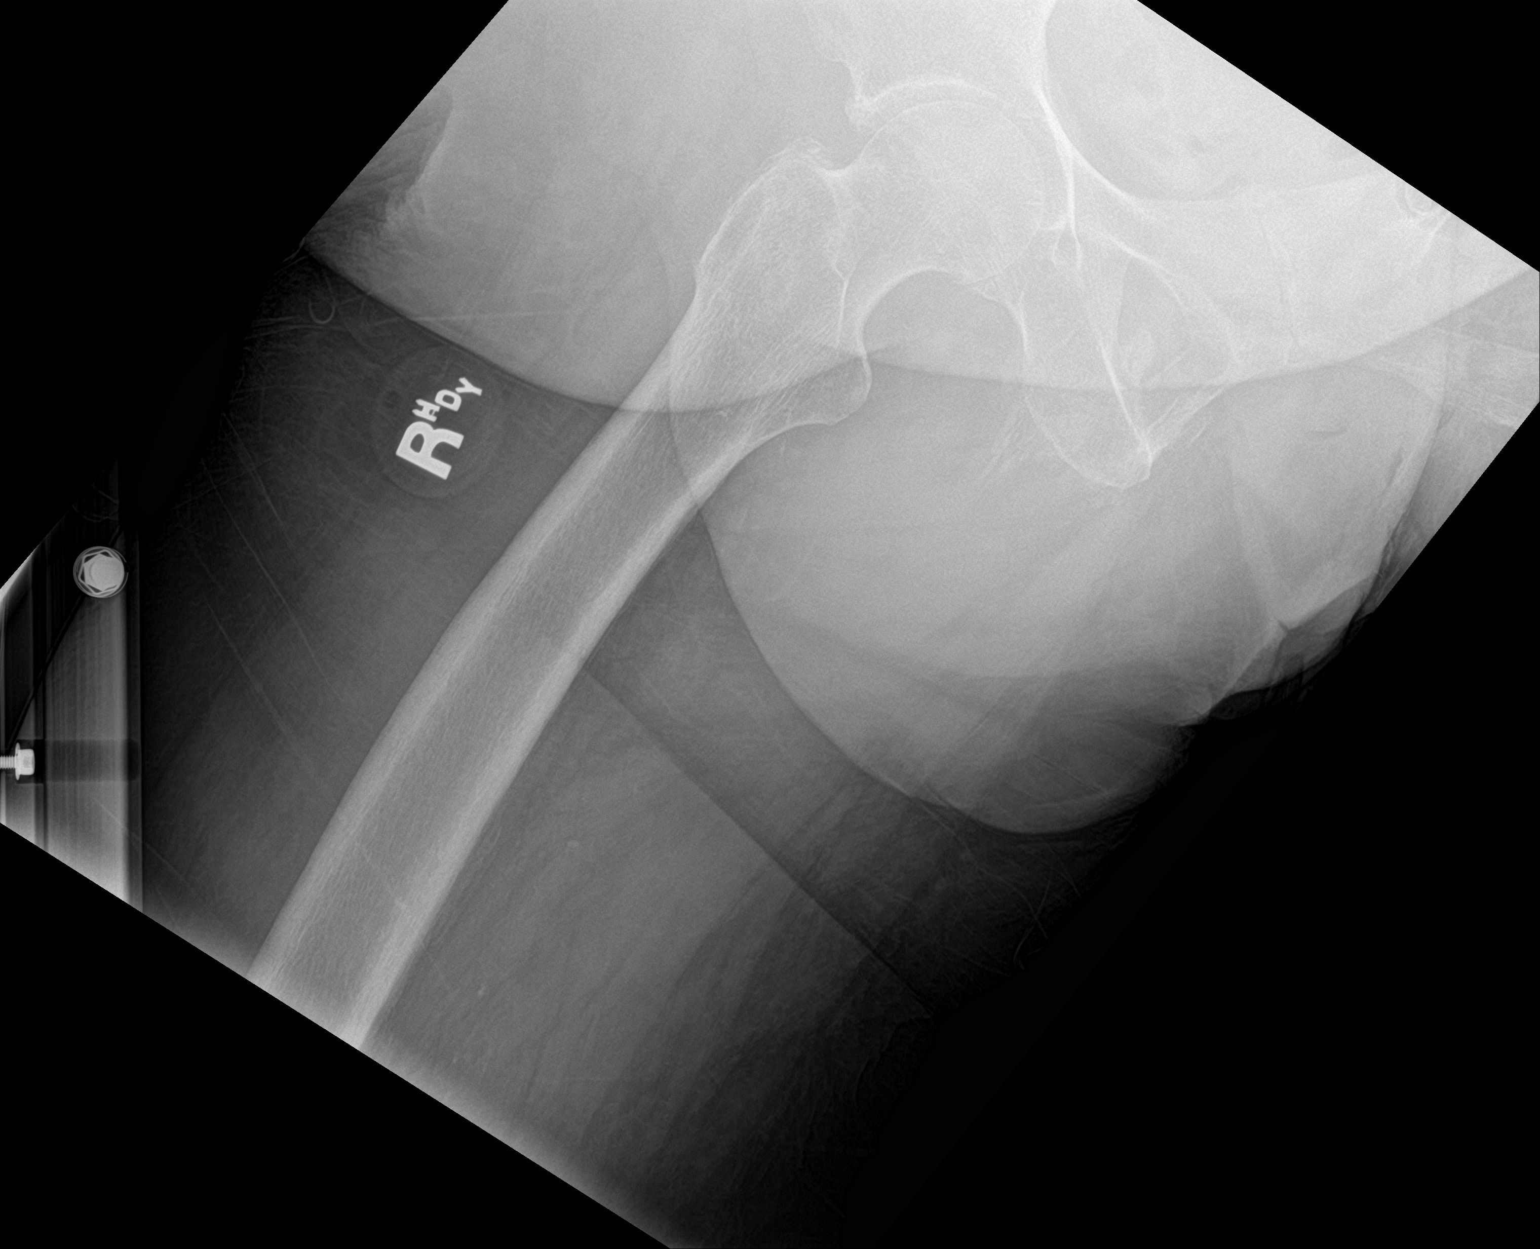

[3 of 3 positions shown; findings below may reference images not displayed]

FINDINGS: There is no evidence of acute fracture, subluxation or dislocation.

Mild degenerative changes in both hips are present.

No suspicious bony lesions are identified.
IMPRESSION: No acute abnormality.

## 2021-07-02 IMAGING — US US PELVIS COMPLETE WITH TRANSVAGINAL
1 series · 14 of 25 positions shown · non-contrast
Comparison: None

CLINICAL DATA: Vaginal bleeding



[Series 1: us pelvis complete with transvaginal · 14 of 26 slices shown]
[im 1/26]
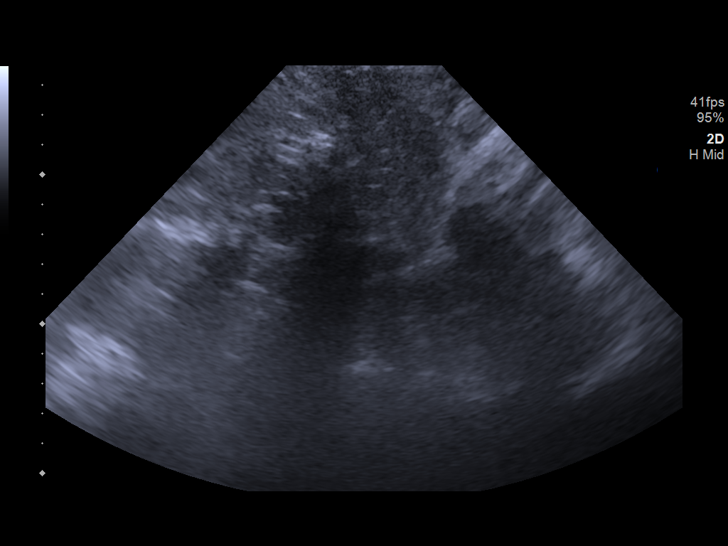
[im 3/26]
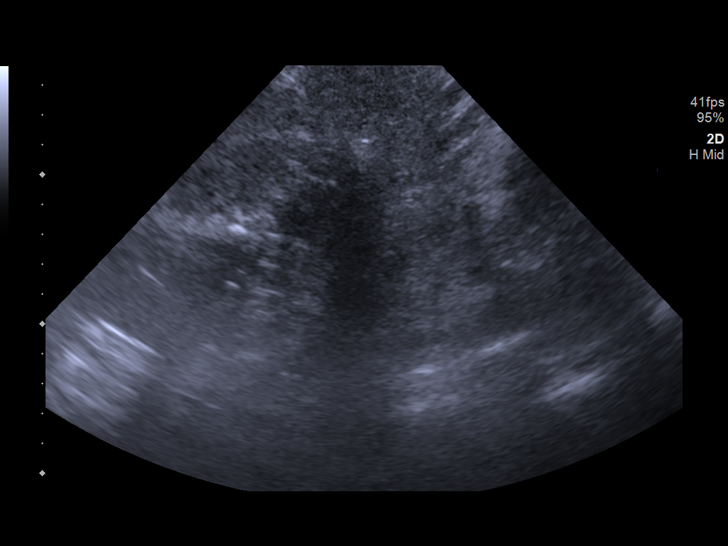
[im 5/26]
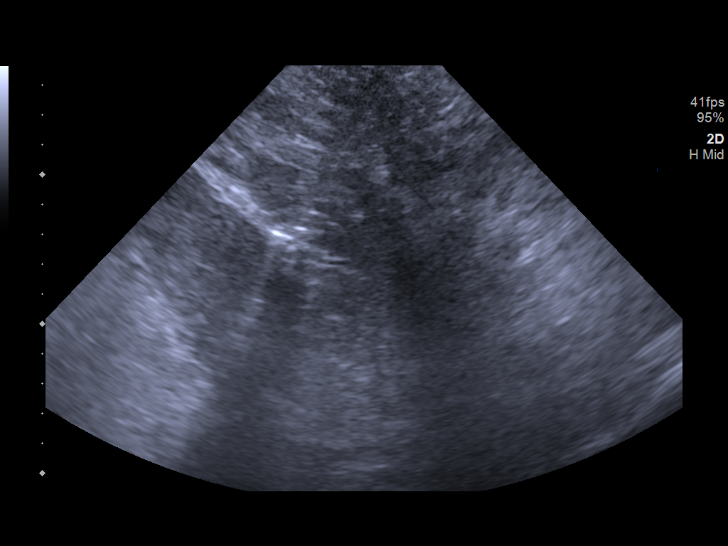
[im 7/26]
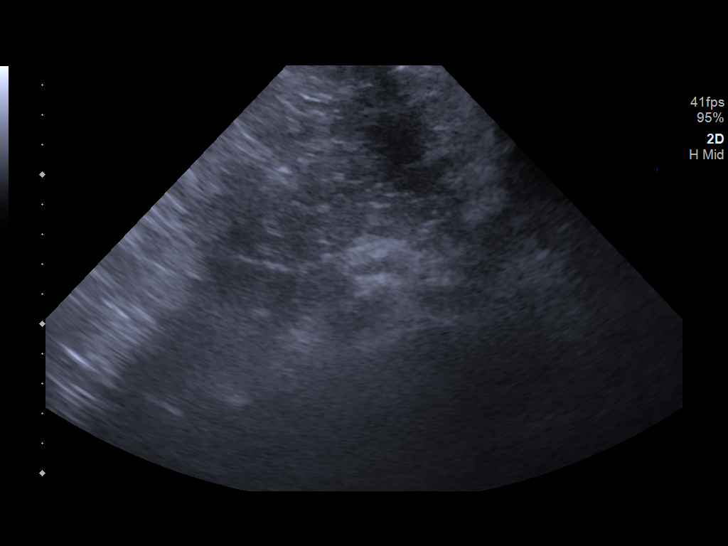
[im 9/26]
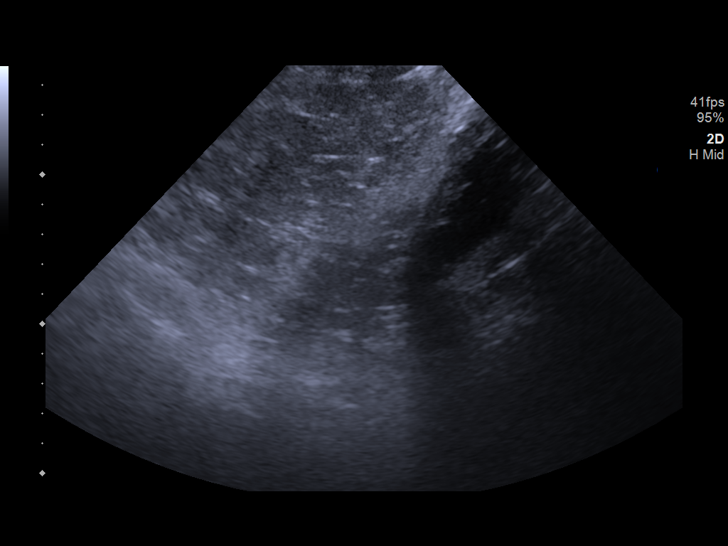
[im 10/26]
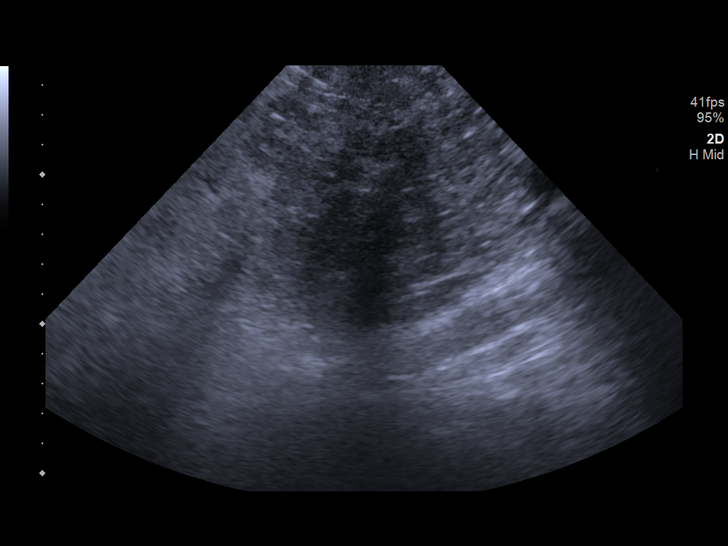
[im 12/26]
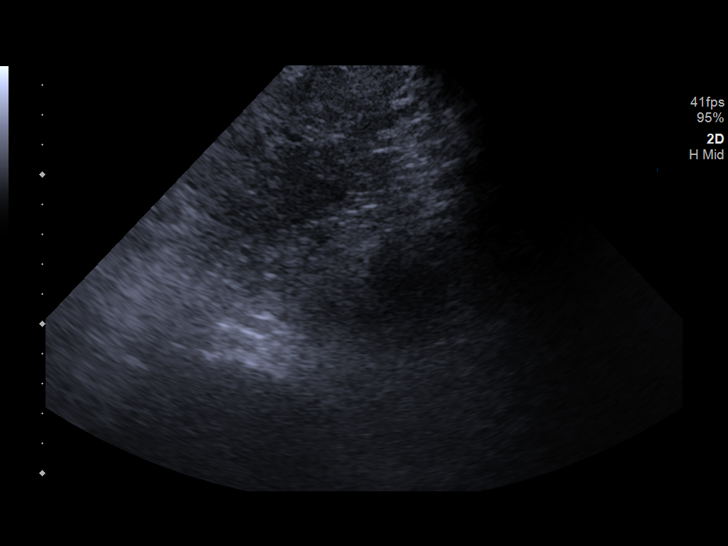
[im 14/26]
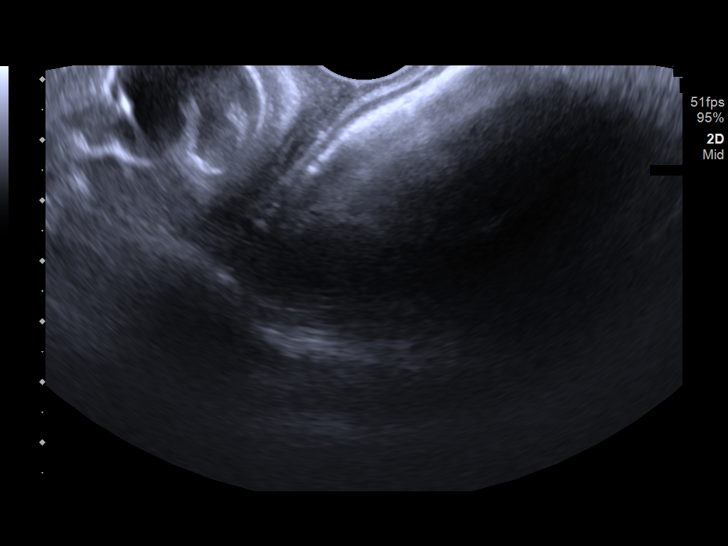
[im 16/26]
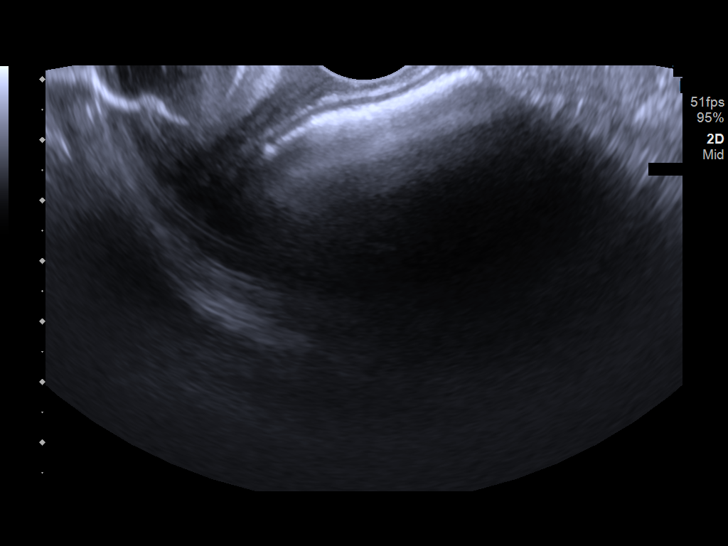
[im 17/26]
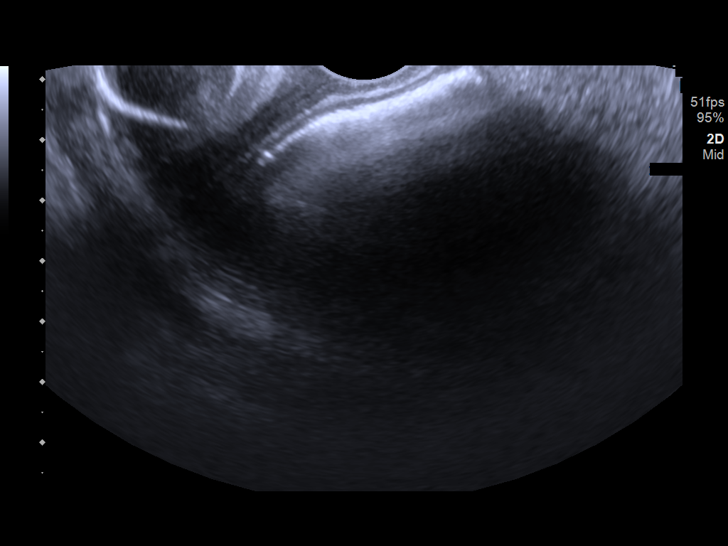
[im 19/26]
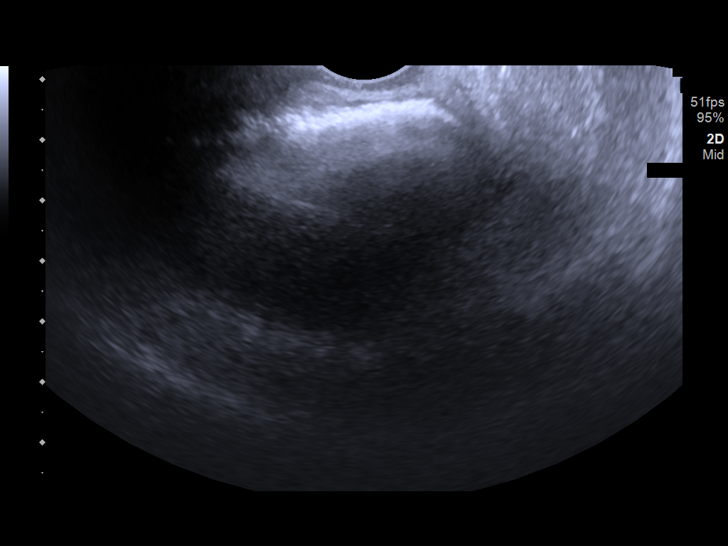
[im 21/26]
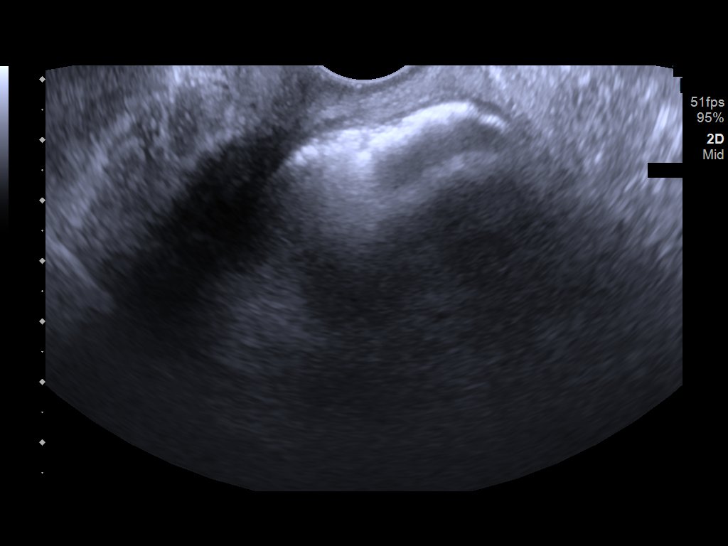
[im 23/26]
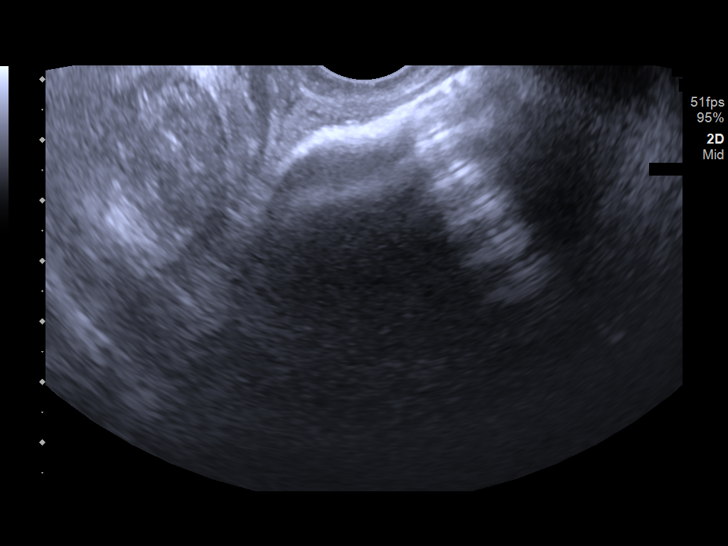
[im 26/26]
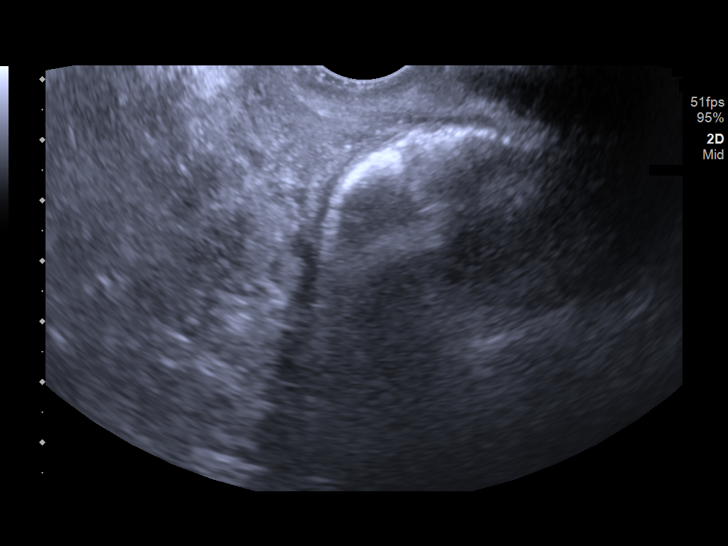

[14 of 25 positions shown; findings below may reference images not displayed]

FINDINGS: Uterus

Surgically absent

Endometrium

Surgically absent

Right ovary

Not visualized.

Left ovary

Not visualized.

Other findings

Patient is post hysterectomy. Imaging quality is significantly
degraded by body habitus and inability to position patient due to
ICU ventilator status and a right groin arterial line limiting the
use of stirrups.
IMPRESSION: Significant study limitations due to inability to reposition patient
and the patient's ventilated ICU status.

Post hysterectomy.  The ovaries are not visualized.
